# Patient Record
Sex: Female | Born: 1980 | Race: Black or African American | Hispanic: No | Marital: Single | Smoking: Never smoker
Health system: Southern US, Community
[De-identification: ages and names within clinical notes are randomized; demographics above are authoritative.]

## PROBLEM LIST (undated history)

## (undated) DIAGNOSIS — G509 Disorder of trigeminal nerve, unspecified: Secondary | ICD-10-CM

## (undated) DIAGNOSIS — G589 Mononeuropathy, unspecified: Secondary | ICD-10-CM

## (undated) DIAGNOSIS — K219 Gastro-esophageal reflux disease without esophagitis: Secondary | ICD-10-CM

## (undated) HISTORY — PX: OTHER SURGICAL HISTORY: SHX169

## (undated) HISTORY — PX: BRAIN SURGERY: SHX531

---
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
## 2000-04-14 ENCOUNTER — Emergency Department (HOSPITAL_COMMUNITY): Admission: EM | Admit: 2000-04-14 | Discharge: 2000-04-14 | Payer: Self-pay | Admitting: Emergency Medicine

## 2000-06-24 ENCOUNTER — Emergency Department (HOSPITAL_COMMUNITY): Admission: EM | Admit: 2000-06-24 | Discharge: 2000-06-24 | Payer: Self-pay | Admitting: Emergency Medicine

## 2000-12-04 ENCOUNTER — Inpatient Hospital Stay (HOSPITAL_COMMUNITY): Admission: AD | Admit: 2000-12-04 | Discharge: 2000-12-04 | Payer: Self-pay | Admitting: Obstetrics

## 2000-12-08 ENCOUNTER — Inpatient Hospital Stay (HOSPITAL_COMMUNITY): Admission: AD | Admit: 2000-12-08 | Discharge: 2000-12-08 | Payer: Self-pay | Admitting: *Deleted

## 2001-01-25 ENCOUNTER — Inpatient Hospital Stay (HOSPITAL_COMMUNITY): Admission: AD | Admit: 2001-01-25 | Discharge: 2001-01-25 | Payer: Self-pay | Admitting: *Deleted

## 2001-01-26 ENCOUNTER — Inpatient Hospital Stay (HOSPITAL_COMMUNITY): Admission: AD | Admit: 2001-01-26 | Discharge: 2001-01-26 | Payer: Self-pay | Admitting: *Deleted

## 2001-01-26 ENCOUNTER — Encounter: Payer: Self-pay | Admitting: Obstetrics & Gynecology

## 2001-03-24 ENCOUNTER — Inpatient Hospital Stay (HOSPITAL_COMMUNITY): Admission: AD | Admit: 2001-03-24 | Discharge: 2001-03-24 | Payer: Self-pay | Admitting: *Deleted

## 2001-05-06 ENCOUNTER — Inpatient Hospital Stay (HOSPITAL_COMMUNITY): Admission: AD | Admit: 2001-05-06 | Discharge: 2001-05-06 | Payer: Self-pay | Admitting: *Deleted

## 2001-06-04 ENCOUNTER — Inpatient Hospital Stay (HOSPITAL_COMMUNITY): Admission: AD | Admit: 2001-06-04 | Discharge: 2001-06-04 | Payer: Self-pay

## 2001-06-12 ENCOUNTER — Inpatient Hospital Stay (HOSPITAL_COMMUNITY): Admission: AD | Admit: 2001-06-12 | Discharge: 2001-06-12 | Payer: Self-pay | Admitting: *Deleted

## 2001-06-16 ENCOUNTER — Inpatient Hospital Stay (HOSPITAL_COMMUNITY): Admission: AD | Admit: 2001-06-16 | Discharge: 2001-06-16 | Payer: Self-pay | Admitting: *Deleted

## 2001-06-16 ENCOUNTER — Encounter: Payer: Self-pay | Admitting: *Deleted

## 2001-06-18 ENCOUNTER — Inpatient Hospital Stay (HOSPITAL_COMMUNITY): Admission: AD | Admit: 2001-06-18 | Discharge: 2001-06-18 | Payer: Self-pay | Admitting: *Deleted

## 2001-06-22 ENCOUNTER — Inpatient Hospital Stay (HOSPITAL_COMMUNITY): Admission: AD | Admit: 2001-06-22 | Discharge: 2001-06-22 | Payer: Self-pay | Admitting: *Deleted

## 2001-06-23 ENCOUNTER — Inpatient Hospital Stay (HOSPITAL_COMMUNITY): Admission: AD | Admit: 2001-06-23 | Discharge: 2001-06-25 | Payer: Self-pay | Admitting: *Deleted

## 2002-03-11 ENCOUNTER — Emergency Department (HOSPITAL_COMMUNITY): Admission: EM | Admit: 2002-03-11 | Discharge: 2002-03-12 | Payer: Self-pay | Admitting: Emergency Medicine

## 2002-03-25 ENCOUNTER — Emergency Department (HOSPITAL_COMMUNITY): Admission: EM | Admit: 2002-03-25 | Discharge: 2002-03-25 | Payer: Self-pay | Admitting: Emergency Medicine

## 2002-03-29 ENCOUNTER — Emergency Department (HOSPITAL_COMMUNITY): Admission: EM | Admit: 2002-03-29 | Discharge: 2002-03-29 | Payer: Self-pay | Admitting: Emergency Medicine

## 2002-07-26 ENCOUNTER — Encounter: Payer: Self-pay | Admitting: Emergency Medicine

## 2002-07-26 ENCOUNTER — Emergency Department (HOSPITAL_COMMUNITY): Admission: EM | Admit: 2002-07-26 | Discharge: 2002-07-26 | Payer: Self-pay | Admitting: Emergency Medicine

## 2002-07-30 ENCOUNTER — Emergency Department (HOSPITAL_COMMUNITY): Admission: EM | Admit: 2002-07-30 | Discharge: 2002-07-30 | Payer: Self-pay | Admitting: Emergency Medicine

## 2002-07-30 ENCOUNTER — Encounter: Payer: Self-pay | Admitting: Emergency Medicine

## 2002-08-12 ENCOUNTER — Other Ambulatory Visit: Admission: RE | Admit: 2002-08-12 | Discharge: 2002-08-12 | Payer: Self-pay | Admitting: *Deleted

## 2003-02-16 ENCOUNTER — Emergency Department (HOSPITAL_COMMUNITY): Admission: EM | Admit: 2003-02-16 | Discharge: 2003-02-17 | Payer: Self-pay | Admitting: Emergency Medicine

## 2003-04-08 ENCOUNTER — Encounter: Admission: RE | Admit: 2003-04-08 | Discharge: 2003-04-08 | Payer: Self-pay | Admitting: Family Medicine

## 2011-08-29 ENCOUNTER — Encounter (HOSPITAL_COMMUNITY): Payer: Self-pay | Admitting: Emergency Medicine

## 2011-08-29 ENCOUNTER — Emergency Department (HOSPITAL_COMMUNITY)
Admission: EM | Admit: 2011-08-29 | Discharge: 2011-08-29 | Disposition: A | Discharge status: Home / Self Care | Payer: Self-pay | Attending: Emergency Medicine | Admitting: Emergency Medicine

## 2011-08-29 DIAGNOSIS — M545 Low back pain, unspecified: Secondary | ICD-10-CM | POA: Insufficient documentation

## 2011-08-29 DIAGNOSIS — Z975 Presence of (intrauterine) contraceptive device: Secondary | ICD-10-CM | POA: Insufficient documentation

## 2011-08-29 LAB — URINALYSIS, ROUTINE W REFLEX MICROSCOPIC
Bilirubin Urine: NEGATIVE
Hgb urine dipstick: NEGATIVE
Ketones, ur: NEGATIVE mg/dL
Specific Gravity, Urine: 1.039 — ABNORMAL HIGH (ref 1.005–1.030)
Urobilinogen, UA: 0.2 mg/dL (ref 0.0–1.0)

## 2011-08-29 MED ORDER — KETOROLAC TROMETHAMINE 30 MG/ML IJ SOLN
30.0000 mg | Freq: Once | INTRAMUSCULAR | Status: AC
  Administered 2011-08-29: 30 mg via INTRAMUSCULAR
  Filled 2011-08-29: qty 1

## 2011-08-29 MED ORDER — HYDROCODONE-ACETAMINOPHEN 5-500 MG PO TABS: 1.0000 | ORAL_TABLET | Freq: Four times a day (QID) | ORAL | Status: AC | PRN

## 2011-08-29 MED ORDER — CYCLOBENZAPRINE HCL 10 MG PO TABS: 10.0000 mg | ORAL_TABLET | Freq: Two times a day (BID) | ORAL | Status: AC | PRN

## 2011-08-29 NOTE — ED Notes (Addendum)
Pt states she lifted a crate with soda bottles Saturday. Pt reports pain on Sunday when she woke up. Pt unable to find a position of comfort. Pain does not radiate anywhere. Pt reports falling the week before the pain starting

## 2011-08-29 NOTE — ED Provider Notes (Signed)
History     CSN: [id number]  Arrival date & time 08/29/11  1747   First MD Initiated Contact with Patient 08/29/11 1917      Chief Complaint  Patient presents with  . Back Pain    (Consider location/radiation/quality/duration/timing/severity/associated sxs/prior treatment) HPI  Patient presents to the ER with low back pain. She states she was moving heavy bins with sodas and waters with them on Sunday and did not feel any pain at the time. However, yesterday, when she woke up she could not get out of bed because her low back hurt so bad. She denies being pregnant as she has an IUD and she is currently on her period. She has had no urinary symptoms. No bowel incontience. She is ambulatory. She has not numbness, tingling or weakness. No symptoms in her legs. Pt is uncomfortable but is in NAD and VSS.  History reviewed. No pertinent past medical history.  History reviewed. No pertinent past surgical history.  No family history on file.  History  Substance Use Topics  . Smoking status: Never Smoker   . Smokeless tobacco: Not on file  . Alcohol Use: No    OB History    Grav Para Term Preterm Abortions TAB SAB Ect Mult Living                  Review of Systems  Allergies  Review of patient's allergies indicates no known allergies.  Home Medications   Current Outpatient Rx  Name Route Sig Dispense Refill  . IBUPROFEN 200 MG PO TABS Oral Take 200 mg by mouth every 6 (six) hours as needed.    Marland Kitchen NAPROXEN 250 MG PO TABS Oral Take 250 mg by mouth 2 (two) times daily with a meal.      BP 128/76  Pulse 60  Temp(Src) 98.5 F (36.9 C) (Oral)  Resp 14  SpO2 100%  LMP 08/25/2011  Physical Exam  Nursing note and vitals reviewed. Constitutional: She appears well-developed and well-nourished. No distress.  HENT:  Head: Normocephalic and atraumatic.  Eyes: Pupils are equal, round, and reactive to light.  Neck: Normal range of motion. Neck supple.  Cardiovascular: Normal  rate and regular rhythm.   Pulmonary/Chest: Effort normal.  Abdominal: Soft.  Musculoskeletal:       Back:        Equal strength to bilateral lower extremities. Neurosensory  function adequate to both legs. Skin color is normal. Skin is warm and moist. I see no step off deformity, no bony tenderness. Pt is able to ambulate without limp. Pain is relieved when sitting in certain positions. ROM is decreased due to pain. No crepitus, laceration, effusion, swelling.  Pulses are normal   Neurological: She is alert.  Skin: Skin is warm and dry.     HEENT: denies blurry vision or change in hearing PULMONARY: Denies difficulty breathing and SOB CARDIAC: denies chest pain or heart palpitations MUSCULOSKELETAL:  denies being unable to ambulate ABDOMEN AL: denies abdominal pain GU: denies loss of bowel or urinary control NEURO: denies numbness and tingling in extremities     ED Course  Procedures (including critical care time)   Labs Reviewed  URINALYSIS, ROUTINE W REFLEX MICROSCOPIC   No results found.   1. Low back pain       MDM  Patient with back pain. No neurological deficits. Patient is ambulatory. No warning symptoms of back pain including: loss of bowel or bladder control, night sweats, waking from sleep with back pain,  unexplained fevers or weight loss, h/o cancer, IVDU, recent trauma. No concern for cauda equina, epidural abscess, or other serious cause of back pain. Conservative measures such as rest, ice/heat and pain medicine indicated with PCP follow-up if no improvement with conservative management.           Dorthula Matas, PA 08/29/11 2019

## 2011-08-29 NOTE — Discharge Instructions (Signed)
Back Exercises   Back exercises help treat and prevent back injuries. The goal of back exercises is to increase the strength of your abdominal and back muscles and the flexibility of your back. These exercises should be started when you no longer have back pain. Back exercises include:   Pelvic Tilt. Lie on your back with your knees bent. Tilt your pelvis until the lower part of your back is against the floor. Hold this position 5 to 10 sec and repeat 5 to 10 times.   Knee to Chest. Pull first 1 knee up against your chest and hold for 20 to 30 seconds, repeat this with the other knee, and then both knees. This may be done with the other leg straight or bent, whichever feels better.   Sit-Ups or Curl-Ups. Bend your knees 90 degrees. Start with tilting your pelvis, and do a partial, slow sit-up, lifting your trunk only 30 to 45 degrees off the floor. Take at least 2 to 3 seconds for each sit-up. Do not do sit-ups with your knees out straight. If partial sit-ups are difficult, simply do the above but with only tightening your abdominal muscles and holding it as directed.   Hip-Lift. Lie on your back with your knees flexed 90 degrees. Push down with your feet and shoulders as you raise your hips a couple inches off the floor; hold for 10 seconds, repeat 5 to 10 times.   Back arches. Lie on your stomach, propping yourself up on bent elbows. Slowly press on your hands, causing an arch in your low back. Repeat 3 to 5 times. Any initial stiffness and discomfort should lessen with repetition over time.   Shoulder-Lifts. Lie face down with arms beside your body. Keep hips and torso pressed to floor as you slowly lift your head and shoulders off the floor.   Do not overdo your exercises, especially in the beginning. Exercises may cause you some mild back discomfort which lasts for a few minutes; however, if the pain is more severe, or lasts for more than 15 minutes, do not continue exercises until you see your caregiver.  Improvement with exercise therapy for back problems is slow.   See your caregivers for assistance with developing a proper back exercise program.   Document Released: 04/21/2004 Document Revised: 03/03/2011 Document Reviewed: 03/14/2005   Exit[employer]® Patient Information ©2012 [employer].     Back Pain, Adult   Low back pain is very common. About 1 in 5 people have back pain. The cause of low back pain is rarely dangerous. The pain often gets better over time. About half of people with a sudden onset of back pain feel better in just 2 weeks. About 8 in 10 people feel better by 6 weeks.   CAUSES   Some common causes of back pain include:   Strain of the muscles or ligaments supporting the spine.   Wear and tear (degeneration) of the spinal discs.   Arthritis.   Direct injury to the back.   DIAGNOSIS   Most of the time, the direct cause of low back pain is not known. However, back pain can be treated effectively even when the exact cause of the pain is unknown. Answering your caregiver's questions about your overall health and symptoms is one of the most accurate ways to make sure the cause of your pain is not dangerous. If your caregiver needs more information, he or she may order lab work or imaging tests (X-rays or MRIs). However, even   if imaging tests show changes in your back, this usually does not require surgery.   HOME CARE INSTRUCTIONS   For many people, back pain returns. Since low back pain is rarely dangerous, it is often a condition that people can learn to manage on their own.   Remain active. It is stressful on the back to sit or stand in one place. Do not sit, drive, or stand in one place for more than 30 minutes at a time. Take short walks on level surfaces as soon as pain allows. Try to increase the length of time you walk each day.   Do not stay in bed. Resting more than 1 or 2 days can delay your recovery.   Do not avoid exercise or work. Your body is made to move. It is not dangerous to be active,  even though your back may hurt. Your back will likely heal faster if you return to being active before your pain is gone.   Pay attention to your body when you bend and lift. Many people have less discomfort when lifting if they bend their knees, keep the load close to their bodies, and avoid twisting. Often, the most comfortable positions are those that put less stress on your recovering back.   Find a comfortable position to sleep. Use a firm mattress and lie on your side with your knees slightly bent. If you lie on your back, put a pillow under your knees.   Only take over-the-counter or prescription medicines as directed by your caregiver. Over-the-counter medicines to reduce pain and inflammation are often the most helpful. Your caregiver may prescribe muscle relaxant drugs. These medicines help dull your pain so you can more quickly return to your normal activities and healthy exercise.   Put ice on the injured area.   Put ice in a plastic bag.   Place a towel between your skin and the bag.   Leave the ice on for 15 to 20 minutes, 3 to 4 times a day for the first 2 to 3 days. After that, ice and heat may be alternated to reduce pain and spasms.   Ask your caregiver about trying back exercises and gentle massage. This may be of some benefit.   Avoid feeling anxious or stressed. Stress increases muscle tension and can worsen back pain. It is important to recognize when you are anxious or stressed and learn ways to manage it. Exercise is a great option.   SEEK MEDICAL CARE IF:   You have pain that is not relieved with rest or medicine.   You have pain that does not improve in 1 week.   You have new symptoms.   You are generally not feeling well.   SEEK IMMEDIATE MEDICAL CARE IF:   You have pain that radiates from your back into your legs.   You develop new bowel or bladder control problems.   You have unusual weakness or numbness in your arms or legs.   You develop nausea or vomiting.   You develop abdominal  pain.   You feel faint.   Document Released: 03/14/2005 Document Revised: 03/03/2011 Document Reviewed: 08/02/2010   Exit[employer]® Patient Information ©2012 [employer].

## 2011-08-29 NOTE — Progress Notes (Signed)
ED CM spoke with pt who states she has recently moved back to [place], is self pay and without pcp.  CM discussed and provided a list of self pay [place] pcps along with information on [place] resources for medications, health department, DSS, housing, crisis programs, financial resources and dental programs Pt voiced understanding and appreciation of services/resources offered

## 2011-08-29 NOTE — ED Notes (Signed)
Pt presenting to ed with c/o back pain x 2 days. Pt denies any problems with urination. Pt denies injury at this time. Pt denies nausea and vomiting

## 2011-08-30 NOTE — ED Provider Notes (Signed)
Medical screening examination/treatment/procedure(s) were performed by non-physician practitioner and as supervising physician I was immediately available for consultation/collaboration.   Lennin Osmond M Vanassa Penniman, MD 08/30/11 0022 

## 2011-11-02 ENCOUNTER — Encounter (HOSPITAL_COMMUNITY): Payer: Self-pay

## 2011-11-02 ENCOUNTER — Emergency Department (INDEPENDENT_AMBULATORY_CARE_PROVIDER_SITE_OTHER)
Admission: EM | Admit: 2011-11-02 | Discharge: 2011-11-02 | Disposition: A | Discharge status: Home / Self Care | Payer: Self-pay | Source: Home / Self Care | Attending: Emergency Medicine | Admitting: Emergency Medicine

## 2011-11-02 DIAGNOSIS — J020 Streptococcal pharyngitis: Secondary | ICD-10-CM

## 2011-11-02 MED ORDER — PENICILLIN V POTASSIUM 500 MG PO TABS: 500.0000 mg | ORAL_TABLET | Freq: Three times a day (TID) | ORAL | Status: AC

## 2011-11-02 MED ORDER — NAPROXEN 500 MG PO TABS: 500.0000 mg | ORAL_TABLET | Freq: Two times a day (BID) | ORAL | Status: AC

## 2011-11-02 NOTE — ED Provider Notes (Signed)
Chief Complaint  Patient presents with  . Sore Throat    History of Present Illness:   The patient is a 31 year old female who's had a two-day history of a severe sore throat, pain on swallowing, nausea, headache, ear pain, neck pain, and nasal congestion. She denies any fevers, chills, sweats, cough, abdominal pain, or diarrhea. She has not been exposed to strep or mono as far she knows. She has no prior history of strep.  Review of Systems:  Other than as noted above, the patient denies any of the following symptoms. Systemic:  No fever, chills, sweats, fatigue, myalgias, headache, or anorexia. Eye:  No redness, pain or drainage. ENT:  No earache, ear congestion, nasal congestion, sneezing, rhinorrhea, sinus pressure, sinus pain, or post nasal drip. Lungs:  No cough, sputum production, wheezing, shortness of breath, or chest pain. GI:  No abdominal pain, nausea, vomiting, or diarrhea. Skin:  No rash or itching.  PMFSH:  Past medical history, family history, social history, meds, allergies, and nurse's notes were reviewed.  There is no known exposure to strep or mono.  No prior history of step or mono.  The patient denies use of tobacco.  Physical Exam:   Vital signs:  BP 122/77  Pulse 75  Temp 98.6 F (37 C) (Oral)  Resp 17  SpO2 100%  LMP 10/20/2011 General:  Alert, in no distress. Eye:  No conjunctival injection or drainage. Lids were normal. ENT:  TMs and canals were normal, without erythema or inflammation.  Nasal mucosa was clear and uncongested, without drainage.  Mucous membranes were moist.  Exam of pharynx reveals tonsils to be enlarged with whitish exudate and the uvula is also swollen and erythematous.  There were no oral ulcerations or lesions. Neck:  Supple, no adenopathy, tenderness or mass. Lungs:  No respiratory distress.  Lungs were clear to auscultation, without wheezes, rales or rhonchi.  Breath sounds were clear and equal bilaterally.  Heart:  Regular rhythm,  without gallops, murmers or rubs. Skin:  Clear, warm, and dry, without rash or lesions.  Labs:   Results for orders placed during the hospital encounter of 11/02/11  POCT RAPID STREP A ([facility] CARE ONLY)      Component Value Range   Streptococcus, Group A Screen (Direct) POSITIVE (*) NEGATIVE    Assessment:  The encounter diagnosis was Strep throat.  Plan:   1.  The following meds were prescribed:   New Prescriptions   NAPROXEN (NAPROSYN) 500 MG TABLET    Take 1 tablet (500 mg total) by mouth 2 (two) times daily.   PENICILLIN V POTASSIUM (VEETID) 500 MG TABLET    Take 1 tablet (500 mg total) by mouth 3 (three) times daily.   2.  The patient was instructed in symptomatic care including hot saline gargles, throat lozenges, infectious precautions, and need to trade out toothbrush. Handouts were given. 3.  The patient was told to return if becoming worse in any way, if no better in 3 or 4 days, and given some red flag symptoms that would indicate earlier return.     Reuben Likes, MD 11/02/11 [phone]

## 2011-11-02 NOTE — ED Notes (Signed)
C/o nausea since Monday, sore throat and ear pain since yesterday.  States this am her nose was congested.

## 2013-06-25 ENCOUNTER — Encounter (HOSPITAL_COMMUNITY): Payer: Self-pay | Admitting: Emergency Medicine

## 2013-06-25 ENCOUNTER — Emergency Department (HOSPITAL_COMMUNITY)
Admission: EM | Admit: 2013-06-25 | Discharge: 2013-06-25 | Disposition: A | Discharge status: Home / Self Care | Payer: No Typology Code available for payment source | Attending: Emergency Medicine | Admitting: Emergency Medicine

## 2013-06-25 DIAGNOSIS — Z79899 Other long term (current) drug therapy: Secondary | ICD-10-CM | POA: Insufficient documentation

## 2013-06-25 DIAGNOSIS — H9209 Otalgia, unspecified ear: Secondary | ICD-10-CM | POA: Insufficient documentation

## 2013-06-25 DIAGNOSIS — R6884 Jaw pain: Secondary | ICD-10-CM | POA: Insufficient documentation

## 2013-06-25 MED ORDER — MELOXICAM 7.5 MG PO TABS
15.0000 mg | ORAL_TABLET | Freq: Every day | ORAL | Status: DC
Start: 2013-06-25 — End: 2014-01-23

## 2013-06-25 NOTE — ED Provider Notes (Signed)
CSN: [id number]     Arrival date & time 06/25/13  0018 History   First MD Initiated Contact with Patient 06/25/13 0103     Chief Complaint  Patient presents with  . Otalgia  . Jaw Pain     (Consider location/radiation/quality/duration/timing/severity/associated sxs/prior Treatment) HPI Comments: Patient is a 33 year old female with no significant past medical history who presents to the emergency department for left-sided jaw pain. Patient states the pain originates in her preauricular area and radiates down her mandible and back into her ear. She states the pain "comes in waves" and is sharp. She has taken Tylenol #3 without relief. Patient saw her dentist for this last week who could not find anything wrong, but put her on Veetid to cover for infection. She endorses compliance with this abx with no symptom improvement. Patient states pain is worse with jaw opening; she denies trismus. She further denies dental pain/injury, oral bleeding/lesions, difficulty swallowing, drooling, fever, ear drainage, and shortness of breath.  Patient is a 33 y.o. female presenting with ear pain. The history is provided by the patient. No language interpreter was used.  Otalgia   History reviewed. No pertinent past medical history. History reviewed. No pertinent past surgical history. No family history on file. History  Substance Use Topics  . Smoking status: Never Smoker   . Smokeless tobacco: Not on file  . Alcohol Use: Yes     Comment: occ   OB History   Grav Para Term Preterm Abortions TAB SAB Ect Mult Living                 Review of Systems  HENT: Positive for ear pain. Negative for dental problem.        +jaw pain  All other systems reviewed and are negative.      Allergies  Review of patient's allergies indicates no known allergies.  Home Medications   Current Outpatient Rx  Name  Route  Sig  Dispense  Refill  . acetaminophen-codeine (TYLENOL #3) 300-30 MG per tablet   Oral  Take 1 tablet by mouth every 4 (four) hours as needed for moderate pain (pain).         Marland Kitchen HYDROcodone-acetaminophen (NORCO/VICODIN) 5-325 MG per tablet   Oral   Take 1 tablet by mouth every 6 (six) hours as needed for moderate pain (pain).         . Ibuprofen (ADVIL) 200 MG CAPS   Oral   Take 600 mg by mouth every 8 (eight) hours as needed (pain).         Marland Kitchen penicillin v potassium (VEETID) 500 MG tablet   Oral   Take 500 mg by mouth 4 (four) times daily.         . meloxicam (MOBIC) 7.5 MG tablet   Oral   Take 2 tablets (15 mg total) by mouth daily.   30 tablet   0    BP 144/87  Pulse 64  Temp(Src) 98.4 F (36.9 C) (Oral)  Resp 18  SpO2 99%  LMP 06/08/2013  Physical Exam  Nursing note and vitals reviewed. Constitutional: She is oriented to person, place, and time. She appears well-developed and well-nourished. No distress.  HENT:  Head: Normocephalic and atraumatic. Head is without raccoon's eyes, without Battle's sign and without contusion.    Right Ear: Hearing, tympanic membrane, external ear and ear canal normal. No mastoid tenderness.  Left Ear: Hearing, tympanic membrane, external ear and ear canal normal. No mastoid tenderness.  Nose:  Nose normal.  Mouth/Throat: Uvula is midline, oropharynx is clear and moist and mucous membranes are normal. No oral lesions. No trismus in the jaw. Normal dentition. No dental abscesses or dental caries.  TTP at preauricular area on L. Site c/w location of L TMJ joint. No evidence of infectious symptoms; no erythema, swelling, heat to touch, or trismus. No vesicles in or changes to L ear canal. No TTP of dentition. No evidence of dental abscess. Your midline. Patient tolerating secretions without difficulty or drooling.  Eyes: Conjunctivae and EOM are normal. No scleral icterus.  Neck: Normal range of motion.  Pulmonary/Chest: Effort normal. No respiratory distress.  Musculoskeletal: Normal range of motion.  Neurological: She  is alert and oriented to person, place, and time.  Skin: Skin is warm and dry. No rash noted. She is not diaphoretic. No erythema. No pallor.  Psychiatric: She has a normal mood and affect. Her behavior is normal.    ED Course  Procedures (including critical care time) Labs Review Labs Reviewed - No data to display Imaging Review No results found.   EKG Interpretation None      MDM   Final diagnoses:  Jaw pain    Uncomplicated jaw pain. Patient well and nontoxic appearing, hemodynamically stable, and afebrile. No trismus or strider appreciated. Patient speaks in full sentences without difficulty. She is tolerating secretions without difficulty or drooling. No dental changes. No red flags or signs concerning for Ludwig's angina. Pain appreciated to the consistent with site of left TMJ - ? TMJ arthritis vs trigeminal neuralgia. No signs or symptoms to suggest infectious source. L ear, canal and mastoid process normal. Patient stable and appropriate for d/c today with ENT referral for further evaluation of symptoms. Mobic prescribed for symptom control. Return precautions provided and patient agreeable to plan with no unaddressed concerns.   Filed Vitals:   06/25/13 0034  BP: 144/87  Pulse: 64  Temp: 98.4 F (36.9 C)  TempSrc: Oral  Resp: 18  SpO2: 99%       Antonietta Breach, PA-C 06/25/13 [phone]

## 2013-06-25 NOTE — Discharge Instructions (Signed)
Recommend ice, Mobic, and ENT follow up. Return if symptoms worsen.  Temporomandibular Problems  Temporomandibular joint (TMJ) dysfunction means there are problems with the joint between your jaw and your skull. This is a joint lined by cartilage like other joints in your body but also has a small disc in the joint which keeps the bones from rubbing on each other. These joints are like other joints and can get inflamed (sore) from arthritis and other problems. When this joint gets sore, it can cause headaches and pain in the jaw and the face. CAUSES  Usually the arthritic types of problems are caused by soreness in the joint. Soreness in the joint can also be caused by overuse. This may come from grinding your teeth. It may also come from mis-alignment in the joint. DIAGNOSIS Diagnosis of this condition can often be made by history and exam. Sometimes your caregiver may need X-rays or an MRI scan to determine the exact cause. It may be necessary to see your dentist to determine if your teeth and jaws are lined up correctly. TREATMENT  Most of the time this problem is not serious; however, sometimes it can persist (become chronic). When this happens medications that will cut down on inflammation (soreness) help. Sometimes a shot of cortisone into the joint will be helpful. If your teeth are not aligned it may help for your dentist to make a splint for your mouth that can help this problem. If no physical problems can be found, the problem may come from tension. If tension is found to be the cause, biofeedback or relaxation techniques may be helpful. HOME CARE INSTRUCTIONS   Later in the day, applications of ice packs may be helpful. Ice can be used in a plastic bag with a towel around it to prevent frostbite to skin. This may be used about every 2 hours for 20 to 30 minutes, as needed while awake, or as directed by your caregiver.  Only take over-the-counter or prescription medicines for pain,  discomfort, or fever as directed by your caregiver.  If physical therapy was prescribed, follow your caregiver's directions.  Wear mouth appliances as directed if they were given. Document Released: 12/07/2000 Document Revised: 06/06/2011 Document Reviewed: 03/16/2008 [facility] Patient Information 2014 [place], [place].

## 2013-06-25 NOTE — ED Notes (Signed)
Pt states that she has been congested the last few days and Saturday began with left ear pain, left sided jaw pain and pain to left side if face; denies chest pain or shortness of breath

## 2013-06-25 NOTE — ED Provider Notes (Signed)
Medical screening examination/treatment/procedure(s) were conducted as a shared visit with non-physician practitioner(s) or resident  and myself.  I personally evaluated the patient during the encounter and agree with the findings and plan unless otherwise indicated.    I have personally reviewed any xrays and/ or EKG's with the provider and I agree with interpretation.   Patient with worsening left jaw pain. Patient describes severe sharp pain radiating into her left ear and surrounding TMJ region. No history of similar. Patient has tried Tylenol No. 3 and over-the-counter medicines with minimal relief. Patient has seen a dentist who started her on antibiotics. On exam patient is exquisitely tender to the Tmj region worse with opening and closing her mouth, no signs of infection, moist mucous membranes no dental abscess appreciated. Patient denies chest pain or shortness of breath or cardiac history. Clinically pain is related to TMJ and followup with ENT discussed.  TMJ pain  Christine Clonts, MD 06/25/13 [phone]

## 2013-07-22 ENCOUNTER — Emergency Department (HOSPITAL_COMMUNITY): Payer: No Typology Code available for payment source

## 2013-07-22 ENCOUNTER — Emergency Department (HOSPITAL_COMMUNITY)
Admission: EM | Admit: 2013-07-22 | Discharge: 2013-07-22 | Disposition: A | Discharge status: Home / Self Care | Payer: No Typology Code available for payment source | Attending: Emergency Medicine | Admitting: Emergency Medicine

## 2013-07-22 ENCOUNTER — Encounter (HOSPITAL_COMMUNITY): Payer: Self-pay | Admitting: Emergency Medicine

## 2013-07-22 DIAGNOSIS — IMO0002 Reserved for concepts with insufficient information to code with codable children: Secondary | ICD-10-CM | POA: Insufficient documentation

## 2013-07-22 DIAGNOSIS — R5381 Other malaise: Secondary | ICD-10-CM | POA: Insufficient documentation

## 2013-07-22 DIAGNOSIS — E669 Obesity, unspecified: Secondary | ICD-10-CM | POA: Insufficient documentation

## 2013-07-22 DIAGNOSIS — G500 Trigeminal neuralgia: Secondary | ICD-10-CM | POA: Insufficient documentation

## 2013-07-22 DIAGNOSIS — R5383 Other fatigue: Secondary | ICD-10-CM

## 2013-07-22 DIAGNOSIS — Z791 Long term (current) use of non-steroidal anti-inflammatories (NSAID): Secondary | ICD-10-CM | POA: Insufficient documentation

## 2013-07-22 MED ORDER — CARBAMAZEPINE ER 100 MG PO TB12: 100.0000 mg | ORAL_TABLET | Freq: Two times a day (BID) | ORAL | Status: DC

## 2013-07-22 NOTE — ED Provider Notes (Signed)
CSN: [id number]     Arrival date & time 07/22/13  7829 History   First MD Initiated Contact with Patient 07/22/13 [phone]     Chief Complaint  Patient presents with  . facial numbness      (Consider location/radiation/quality/duration/timing/severity/associated sxs/prior Treatment) The history is provided by the patient.   patient presents with left jaw and face pain. She has had pain in the left jaw for the last few months it is coming done. She's been seen in ER and diagnosed with trigeminal neuralgia versus TMJ. She states she is improved somewhat on Mobic. She states she followup with her dentist to give her antibiotics but thought it was not the jaw causing the pain. She states that the pain began to be worse 3 days ago. It is in the same distribution as before, which is in the left jaw and face. She states now that she has numbness on her jaw and cannot smile. No headache. She states she had some drooling earlier today. She had a complex tooth extraction back in August, 8 months ago. She states she had oral surgery because was too deep. She states she went to an urgent care 2 days ago and was told that the numbness and pain may be due to scar tissue from the extraction. Patient states she has pain opening and closing her jaw. No difficulty swallowing.no rash. No fevers  History reviewed. No pertinent past medical history. History reviewed. No pertinent past surgical history. No family history on file. History  Substance Use Topics  . Smoking status: Never Smoker   . Smokeless tobacco: Not on file  . Alcohol Use: Yes     Comment: occ   OB History   Grav Para Term Preterm Abortions TAB SAB Ect Mult Living                 Review of Systems  Constitutional: Negative for activity change and appetite change.  Eyes: Negative for pain.  Respiratory: Negative for chest tightness and shortness of breath.   Cardiovascular: Negative for chest pain and leg swelling.  Gastrointestinal: Negative  for nausea, vomiting, abdominal pain and diarrhea.  Genitourinary: Negative for flank pain.  Musculoskeletal: Negative for back pain, joint swelling and neck stiffness.  Skin: Negative for rash.  Neurological: Positive for weakness and numbness. Negative for headaches.  Psychiatric/Behavioral: Negative for behavioral problems.      Allergies  Review of patient's allergies indicates no known allergies.  Home Medications   Prior to Admission medications   Medication Sig Start Date End Date Taking? Authorizing Provider  HYDROcodone-acetaminophen (NORCO/VICODIN) 5-325 MG per tablet Take 1 tablet by mouth every 6 (six) hours as needed for moderate pain (pain).   Yes Historical Provider, MD  Ibuprofen (ADVIL) 200 MG CAPS Take 600 mg by mouth every 8 (eight) hours as needed (for pain).    Yes Historical Provider, MD  meloxicam (MOBIC) 7.5 MG tablet Take 2 tablets (15 mg total) by mouth daily. 06/25/13  Yes Antonietta Breach, PA-C  predniSONE (DELTASONE) 20 MG tablet Take 20 mg by mouth daily with breakfast. 07/20/13  Yes Historical Provider, MD   BP 133/82  Pulse 76  Temp(Src) 98.8 F (37.1 C) (Oral)  Resp 14  Ht 5\' 4"  (1.626 m)  Wt 274 lb (124.286 kg)  BMI 47.01 kg/m2  SpO2 100%  LMP 06/30/2013 Physical Exam  Constitutional: She is oriented to person, place, and time. She appears well-developed.  Patient is somewhat obese  HENT:  Head:  Normocephalic.  Post dental extraction of tooth over left somewhat posterior mandible. Some tenderness at this site without fluctuance. Number next anterior to the spot. Some tenderness over left TMJ. Difficulty opening jaw due to pain.  Eyes: Pupils are equal, round, and reactive to light.  Neck: Normal range of motion. No thyromegaly present.  Cardiovascular: Normal rate and regular rhythm.   Pulmonary/Chest: Breath sounds normal.  Neurological: She is alert and oriented to person, place, and time. A cranial nerve deficit is present.  Sensation intact  bilaterally forehead. Pain with light touch over V2 distribution on left. Normal on right. Numbness over anterior distribution on C3. There is tenderness over the mental foramen. Some difficulty with smiling on the left side. Good eyebrow raise bilaterally. Good eye closing bilaterally.  Skin: Skin is warm.    ED Course  Procedures (including critical care time) Labs Review Labs Reviewed - No data to display  Imaging Review Dg Orthopantogram  07/22/2013   CLINICAL DATA:  LEFT jaw pain and numbers for 3 days  EXAM: ORTHOPANTOGRAM/PANORAMIC  COMPARISON:  None.  FINDINGS: Surgical absence of teeth 15, 16, 17, and 19.  Scattered dental fillings.  No periodontal lucencies.  No mandibular fracture or bone destruction.  Osseous mineralization normal.  IMPRESSION: Prior dental extractions.  No acute abnormalities.   Electronically Signed   By: Lavonia Dana M.D.   On: 07/22/2013 10:49     EKG Interpretation None      MDM   Final diagnoses:  Trigeminal neuralgia    Patient has left face numbness with some weakness. Also has pain. Panorex is reassuring. No other neurologic deficits. They be related to a trigeminal neuralgia. I discussed with neurology, however they have not seen the patient. Will start Tegretol and have patient follow with neurology and will likely require an outpatient MRI. Will discharge home    Jasper Riling. Alvino Chapel, MD 07/22/13 1227

## 2013-07-22 NOTE — ED Notes (Addendum)
Pt went to urgent care on Saturday with this complaint, provider put her on prednisone and gave a shot of torodol per patient, states she has not had any medications this morning. Pt states she has been in excruciating pain since Wednesday, states she woke up with it. Pt states that urgent care provider stated numbness may be r/t to tooth extraction in August.

## 2013-07-22 NOTE — Discharge Instructions (Signed)
Trigeminal Neuralgia Trigeminal neuralgia is a nerve disorder that causes sudden attacks of severe facial pain. It is caused by damage to the trigeminal nerve, a major nerve in the face. It is more common in women and in the elderly, although it can also happen in younger patients. Attacks last from a few seconds to several minutes and can occur from a couple of times per year to several times per day. Trigeminal neuralgia can be a very distressing and disabling condition. Surgery may be needed in very severe cases if medical treatment does not give relief. HOME CARE INSTRUCTIONS   If your caregiver prescribed medication to help prevent attacks, take as directed.  To help prevent attacks:  Chew on the unaffected side of the mouth.  Avoid touching your face.  Avoid blasts of hot or cold air.  Men may wish to grow a beard to avoid having to shave. SEEK IMMEDIATE MEDICAL CARE IF:  Pain is unbearable and your medicine does not help.  You develop new, unexplained symptoms (problems).  You have problems that may be related to a medication you are taking. Document Released: 03/11/2000 Document Revised: 06/06/2011 Document Reviewed: 01/09/2009 [employer] Patient Information 2014 [employer].  

## 2013-07-22 NOTE — ED Notes (Signed)
Per pt, states left jaw pain and facial numbness since Wed

## 2014-01-22 ENCOUNTER — Encounter (HOSPITAL_COMMUNITY): Payer: Self-pay | Admitting: Emergency Medicine

## 2014-01-22 ENCOUNTER — Emergency Department (HOSPITAL_COMMUNITY)
Admission: EM | Admit: 2014-01-22 | Discharge: 2014-01-23 | Disposition: A | Discharge status: Home / Self Care | Payer: No Typology Code available for payment source | Attending: Emergency Medicine | Admitting: Emergency Medicine

## 2014-01-22 DIAGNOSIS — R55 Syncope and collapse: Secondary | ICD-10-CM | POA: Diagnosis not present

## 2014-01-22 DIAGNOSIS — R4020 Unspecified coma: Secondary | ICD-10-CM | POA: Diagnosis present

## 2014-01-22 DIAGNOSIS — Z7952 Long term (current) use of systemic steroids: Secondary | ICD-10-CM | POA: Insufficient documentation

## 2014-01-22 DIAGNOSIS — Z8669 Personal history of other diseases of the nervous system and sense organs: Secondary | ICD-10-CM | POA: Insufficient documentation

## 2014-01-22 DIAGNOSIS — Z791 Long term (current) use of non-steroidal anti-inflammatories (NSAID): Secondary | ICD-10-CM | POA: Insufficient documentation

## 2014-01-22 DIAGNOSIS — Z79899 Other long term (current) drug therapy: Secondary | ICD-10-CM | POA: Diagnosis not present

## 2014-01-22 HISTORY — DX: Mononeuropathy, unspecified: G58.9

## 2014-01-22 HISTORY — DX: Disorder of trigeminal nerve, unspecified: G50.9

## 2014-01-22 NOTE — ED Notes (Signed)
Bed: WA14 Expected date:  Expected time:  Means of arrival:  Comments: EMS/syncope 

## 2014-01-22 NOTE — ED Notes (Signed)
CBG 117 

## 2014-01-22 NOTE — ED Notes (Signed)
Per EMS pt from church, had a syncopal episode in the middle of a celebration at church, pt was taken to another room, pt was emotionally upset, pt states was at the doctor's today and had 11 vials of blood drawn, pt has had recent issues w/ syncopal episodes, pt states it's a "nerve disorder", this is why pt had blood drawn earlier today, pt is on keppra for this nerve disorder. BP 110/64, HR 96 sinus

## 2014-01-23 ENCOUNTER — Encounter (HOSPITAL_COMMUNITY): Payer: Self-pay | Admitting: Emergency Medicine

## 2014-01-23 LAB — I-STAT TROPONIN, ED: TROPONIN I, POC: 0.01 ng/mL (ref 0.00–0.08)

## 2014-01-23 LAB — I-STAT CHEM 8, ED
BUN: 9 mg/dL (ref 6–23)
CALCIUM ION: 1.16 mmol/L (ref 1.12–1.23)
CHLORIDE: 106 meq/L (ref 96–112)
Creatinine, Ser: 1 mg/dL (ref 0.50–1.10)
GLUCOSE: 103 mg/dL — AB (ref 70–99)
HEMATOCRIT: 44 % (ref 36.0–46.0)
HEMOGLOBIN: 15 g/dL (ref 12.0–15.0)
POTASSIUM: 3.8 meq/L (ref 3.7–5.3)
Sodium: 141 mEq/L (ref 137–147)
TCO2: 26 mmol/L (ref 0–100)

## 2014-01-23 LAB — CBG MONITORING, ED: GLUCOSE-CAPILLARY: 95 mg/dL (ref 70–99)

## 2014-01-23 MED ORDER — SODIUM CHLORIDE 0.9 % IV SOLN
1000.0000 mL | Freq: Once | INTRAVENOUS | Status: AC
  Administered 2014-01-23: 1000 mL via INTRAVENOUS

## 2014-01-23 MED ORDER — SODIUM CHLORIDE 0.9 % IV SOLN
1000.0000 mL | INTRAVENOUS | Status: DC
  Administered 2014-01-23: 1000 mL via INTRAVENOUS

## 2014-01-23 MED ORDER — ONDANSETRON HCL 4 MG/2ML IJ SOLN
4.0000 mg | Freq: Once | INTRAMUSCULAR | Status: AC
  Administered 2014-01-23: 4 mg via INTRAVENOUS
  Filled 2014-01-23: qty 2

## 2014-01-23 NOTE — ED Provider Notes (Signed)
CSN: [id number]     Arrival date & time 01/22/14  2353 History   First MD Initiated Contact with Patient 01/23/14 0014     Chief Complaint  Patient presents with  . Loss of Consciousness     (Consider location/radiation/quality/duration/timing/severity/associated sxs/prior Treatment) The history is provided by the patient and medical records. No language interpreter was used.    Christine Alvarado is a 33 y.o. female  with a hx of "nerve disorder" presents to the Emergency Department complaining of acute syncopal episode while at church tonight onset 30 min ago.  Pt reports she got on her knees to pray, felt poorly then passed out.  She endorses lightheadedness and a feeling of spinning around the room.  Pt reports its better with her eyes closed.  Pt reports this has happened before, last episode Oct 11th.  Pt reports she saw Dr. Baltazar Najjar [facility] neurology today who drew 11 vials of blood. Pt reports she went for the trigeminal neuralgia. No aggravating or alleviating factors.  Pt denies fever, chills, headache, neck pain, chest pain, SOB, abd pain, N/V/D, weakness, dysuria.  .     Past Medical History  Diagnosis Date  . Nerve disorder   . Trigeminal nerve disease    History reviewed. No pertinent past surgical history. No family history on file. History  Substance Use Topics  . Smoking status: Never Smoker   . Smokeless tobacco: Not on file  . Alcohol Use: Yes     Comment: occ   OB History   Grav Para Term Preterm Abortions TAB SAB Ect Mult Living                 Review of Systems  Constitutional: Negative for fever, diaphoresis, appetite change, fatigue and unexpected weight change.  HENT: Negative for mouth sores.   Eyes: Negative for visual disturbance.  Respiratory: Negative for cough, chest tightness, shortness of breath and wheezing.   Cardiovascular: Negative for chest pain.  Gastrointestinal: Negative for nausea, vomiting, abdominal pain, diarrhea and constipation.   Endocrine: Negative for polydipsia, polyphagia and polyuria.  Genitourinary: Negative for dysuria, urgency, frequency and hematuria.  Musculoskeletal: Negative for back pain and neck stiffness.  Skin: Negative for rash.  Allergic/Immunologic: Negative for immunocompromised state.  Neurological: Positive for dizziness, syncope and light-headedness. Negative for headaches.  Hematological: Does not bruise/bleed easily.  Psychiatric/Behavioral: Negative for sleep disturbance. The patient is not nervous/anxious.       Allergies  Review of patient's allergies indicates no known allergies.  Home Medications   Prior to Admission medications   Medication Sig Start Date End Date Taking? Authorizing Provider  levETIRAcetam (KEPPRA XR) 500 MG 24 hr tablet Take 500-1,000 mg by mouth 2 (two) times daily. 500mg  in the morning and 1000mg  in the evening.   Yes Historical Provider, MD  carbamazepine (TEGRETOL-XR) 100 MG 12 hr tablet Take 1 tablet (100 mg total) by mouth 2 (two) times daily. 07/22/13   Jasper Riling. Pickering, MD  HYDROcodone-acetaminophen (NORCO/VICODIN) 5-325 MG per tablet Take 1 tablet by mouth every 6 (six) hours as needed for moderate pain (pain).    Historical Provider, MD  Ibuprofen (ADVIL) 200 MG CAPS Take 600 mg by mouth every 8 (eight) hours as needed (for pain).     Historical Provider, MD  meloxicam (MOBIC) 7.5 MG tablet Take 2 tablets (15 mg total) by mouth daily. 06/25/13   Antonietta Breach, PA-C  predniSONE (DELTASONE) 20 MG tablet Take 20 mg by mouth daily with breakfast.  07/20/13   Historical Provider, MD   BP 116/88  Pulse 93  Temp(Src) 98 F (36.7 C) (Oral)  Resp 22  SpO2 100%  LMP 01/18/2014 Physical Exam  Nursing note and vitals reviewed. Constitutional: She is oriented to person, place, and time. She appears well-developed and well-nourished. No distress.  HENT:  Head: Normocephalic and atraumatic.  Mouth/Throat: Oropharynx is clear and moist. No oropharyngeal exudate.   Eyes: Conjunctivae and EOM are normal. Pupils are equal, round, and reactive to light. No scleral icterus.  No horizontal, vertical or rotational nystagmus  Neck: Normal range of motion. Neck supple.  Full active and passive ROM without pain No midline or paraspinal tenderness No nuchal rigidity or meningeal signs  Cardiovascular: Normal rate, regular rhythm, normal heart sounds and intact distal pulses.   No murmur heard. Pulmonary/Chest: Effort normal and breath sounds normal. No respiratory distress. She has no wheezes. She has no rales.  Abdominal: Soft. Bowel sounds are normal. She exhibits no distension. There is no tenderness. There is no rebound and no guarding.  Musculoskeletal: Normal range of motion.  Full range of motion of the T-spine and L-spine No tenderness to palpation of the spinous processes of the T-spine or L-spine No tenderness to palpation of the paraspinous muscles of the L-spine  Lymphadenopathy:    She has no cervical adenopathy.  Neurological: She is alert and oriented to person, place, and time. She has normal reflexes. No cranial nerve deficit. She exhibits normal muscle tone. Coordination normal.  Reflex Scores:      Bicep reflexes are 2+ on the right side and 2+ on the left side.      Brachioradialis reflexes are 2+ on the right side and 2+ on the left side.      Patellar reflexes are 2+ on the right side and 2+ on the left side.      Achilles reflexes are 2+ on the right side and 2+ on the left side. Mental Status:  Alert, oriented, thought content appropriate. Speech fluent without evidence of aphasia. Able to follow 2 step commands without difficulty.  Cranial Nerves:  II:  Peripheral visual fields grossly normal, pupils equal, round, reactive to light III,IV, VI: ptosis not present, extra-ocular motions intact bilaterally  V,VII: smile symmetric, facial light touch sensation equal VIII: hearing grossly normal bilaterally  IX,X: gag reflex present   XI: bilateral shoulder shrug equal and strong XII: midline tongue extension  Motor:  5/5 in upper and lower extremities bilaterally including strong and equal grip strength and dorsiflexion/plantar flexion Sensory: Pinprick and light touch normal in all extremities.  Deep Tendon Reflexes: 2+ and symmetric  Cerebellar: normal finger-to-nose with bilateral upper extremities Gait: gait testing deferred CV: distal pulses palpable throughout   Skin: Skin is warm and dry. No rash noted. She is not diaphoretic. No erythema.  Psychiatric: She has a normal mood and affect. Her behavior is normal. Judgment and thought content normal.    ED Course  Procedures (including critical care time) Labs Review Labs Reviewed  I-STAT CHEM 8, ED - Abnormal; Notable for the following:    Glucose, Bld 103 (*)    All other components within normal limits  URINALYSIS, ROUTINE W REFLEX MICROSCOPIC  POCT CBG (FASTING - GLUCOSE)-MANUAL ENTRY  I-STAT TROPOININ, ED  CBG MONITORING, ED    Imaging Review No results found.   EKG Interpretation   Date/Time:  Thursday January 23 2014 00:45:00 EDT Ventricular Rate:  58 PR Interval:  122 QRS Duration: 85  QT Interval:  423 QTC Calculation: 415 R Axis:   24 Text Interpretation:  Sinus rhythm Consider anterior infarct Confirmed by  OTTER  MD, OLGA ([id number]) on 01/23/2014 12:49:34 AM      MDM   Final diagnoses:  Syncope and collapse   Juri L Rossner presents after syncope.  Patient without arrhythmia or tachycardia while here in the department.  Patient without history of congestive heart failure, normal hematocrit, normal ECG, no shortness of breath and systolic blood pressure greater than 90; patient is low risk. Will plan for discharge home with close cardiology follow-up.  She has been given fluid bolus with resolution od dizziness and lightheadedness.  No orthostatic vital signs or hypotension.  Pt ambulates in room without difficulty.  Possibility of  recurrent syncope has been discussed. I discussed reasons to avoid driving until neurology followup and other safety preventions including use of ladders and working at heights.   Pt has remained hemodynamically stable throughout their time in the ED  BP 116/88  Pulse 93  Temp(Src) 98 F (36.7 C) (Oral)  Resp 22  SpO2 100%  LMP 01/18/2014   The patient was discussed with Dr. Sharol Given who agrees with the treatment plan.    Jarrett Soho Marlis Oldaker, PA-C 01/23/14 0101

## 2014-01-23 NOTE — ED Provider Notes (Signed)
Medical screening examination/treatment/procedure(s) were performed by non-physician practitioner and as supervising physician I was immediately available for consultation/collaboration.   EKG Interpretation   Date/Time:  Thursday January 23 2014 00:45:00 EDT Ventricular Rate:  58 PR Interval:  122 QRS Duration: 85 QT Interval:  423 QTC Calculation: 415 R Axis:   24 Text Interpretation:  Sinus rhythm Consider anterior infarct Confirmed by  Roniesha Hollingshead  MD, Zivah Mayr ([id number]) on 01/23/2014 12:49:34 AM       Kalman Drape, MD 01/23/14 [phone]

## 2014-01-23 NOTE — ED Notes (Signed)
Pt states she was at church, knelt down to pray and started feeling dizzy, sat down and then doesn't remember after that, states has a nerve disorder and went to her PCP today and had a lot of blood drawn, states she was told to go home and lay down d/t the amount drawn, states has a hx of syncopal episodes, pt states the room is spinning.

## 2014-01-23 NOTE — Discharge Instructions (Signed)
1. Medications: usual home medications 2. Treatment: rest, drink plenty of fluids,  3. Follow Up: Please followup with your primary doctor in 7 days for discussion of your diagnoses and further evaluation after today's visit; if you do not have a primary care doctor use the resource guide provided to find one; Please return to the ER for recurrent episodes    Syncope Syncope is a medical term for fainting or passing out. This means you lose consciousness and drop to the ground. People are generally unconscious for less than 5 minutes. You may have some muscle twitches for up to 15 seconds before waking up and returning to normal. Syncope occurs more often in older adults, but it can happen to anyone. While most causes of syncope are not dangerous, syncope can be a sign of a serious medical problem. It is important to seek medical care.  CAUSES  Syncope is caused by a sudden drop in blood flow to the brain. The specific cause is often not determined. Factors that can bring on syncope include:  Taking medicines that lower blood pressure.  Sudden changes in posture, such as standing up quickly.  Taking more medicine than prescribed.  Standing in one place for too long.  Seizure disorders.  Dehydration and excessive exposure to heat.  Low blood sugar (hypoglycemia).  Straining to have a bowel movement.  Heart disease, irregular heartbeat, or other circulatory problems.  Fear, emotional distress, seeing blood, or severe pain. SYMPTOMS  Right before fainting, you may:  Feel dizzy or light-headed.  Feel nauseous.  See all white or all black in your field of vision.  Have cold, clammy skin. DIAGNOSIS  Your health care provider will ask about your symptoms, perform a physical exam, and perform an electrocardiogram (ECG) to record the electrical activity of your heart. Your health care provider may also perform other heart or blood tests to determine the cause of your syncope which may  include:  Transthoracic echocardiogram (TTE). During echocardiography, sound waves are used to evaluate how blood flows through your heart.  Transesophageal echocardiogram (TEE).  Cardiac monitoring. This allows your health care provider to monitor your heart rate and rhythm in real time.  Holter monitor. This is a portable device that records your heartbeat and can help diagnose heart arrhythmias. It allows your health care provider to track your heart activity for several days, if needed.  Stress tests by exercise or by giving medicine that makes the heart beat faster. TREATMENT  In most cases, no treatment is needed. Depending on the cause of your syncope, your health care provider may recommend changing or stopping some of your medicines. HOME CARE INSTRUCTIONS  Have someone stay with you until you feel stable.  Do not drive, use machinery, or play sports until your health care provider says it is okay.  Keep all follow-up appointments as directed by your health care provider.  Lie down right away if you start feeling like you might faint. Breathe deeply and steadily. Wait until all the symptoms have passed.  Drink enough fluids to keep your urine clear or pale yellow.  If you are taking blood pressure or heart medicine, get up slowly and take several minutes to sit and then stand. This can reduce dizziness. SEEK IMMEDIATE MEDICAL CARE IF:   You have a severe headache.  You have unusual pain in the chest, abdomen, or back.  You are bleeding from your mouth or rectum, or you have black or tarry stool.  You have  an irregular or very fast heartbeat.  You have pain with breathing.  You have repeated fainting or seizure-like jerking during an episode.  You faint when sitting or lying down.  You have confusion.  You have trouble walking.  You have severe weakness.  You have vision problems. If you fainted, call your local emergency services (911 in [place] Do not drive  yourself to the hospital.  MAKE SURE YOU:  Understand these instructions.  Will watch your condition.  Will get help right away if you are not doing well or get worse. Document Released: 03/14/2005 Document Revised: 03/19/2013 Document Reviewed: 05/13/2011 [facility] Patient Information 2015 [place], [place]. This information is not intended to replace advice given to you by your health care provider. Make sure you discuss any questions you have with your health care provider.

## 2014-01-23 NOTE — ED Notes (Signed)
Pt started breathing heavy when standing for orthostatic vitals, states "I need to sit down, I'm going to pass out", pt was able to stand to get BP.

## 2014-02-08 ENCOUNTER — Encounter (HOSPITAL_COMMUNITY): Payer: Self-pay | Admitting: Emergency Medicine

## 2014-02-08 ENCOUNTER — Emergency Department (HOSPITAL_COMMUNITY): Payer: No Typology Code available for payment source

## 2014-02-08 ENCOUNTER — Emergency Department (HOSPITAL_COMMUNITY)
Admission: EM | Admit: 2014-02-08 | Discharge: 2014-02-08 | Disposition: A | Discharge status: Home / Self Care | Payer: No Typology Code available for payment source | Attending: Emergency Medicine | Admitting: Emergency Medicine

## 2014-02-08 DIAGNOSIS — R42 Dizziness and giddiness: Secondary | ICD-10-CM | POA: Insufficient documentation

## 2014-02-08 DIAGNOSIS — R110 Nausea: Secondary | ICD-10-CM | POA: Diagnosis not present

## 2014-02-08 DIAGNOSIS — R55 Syncope and collapse: Secondary | ICD-10-CM | POA: Diagnosis not present

## 2014-02-08 DIAGNOSIS — Z79899 Other long term (current) drug therapy: Secondary | ICD-10-CM | POA: Diagnosis not present

## 2014-02-08 LAB — BASIC METABOLIC PANEL
Anion gap: 11 (ref 5–15)
BUN: 10 mg/dL (ref 6–23)
CO2: 24 mEq/L (ref 19–32)
Calcium: 8.5 mg/dL (ref 8.4–10.5)
Chloride: 106 mEq/L (ref 96–112)
Creatinine, Ser: 0.99 mg/dL (ref 0.50–1.10)
GFR calc non Af Amer: 74 mL/min — ABNORMAL LOW (ref >90)
GFR, EST AFRICAN AMERICAN: 86 mL/min — AB (ref >90)
Glucose, Bld: 118 mg/dL — ABNORMAL HIGH (ref 70–99)
POTASSIUM: 4.5 meq/L (ref 3.7–5.3)
Sodium: 141 mEq/L (ref 137–147)

## 2014-02-08 LAB — CBC
HEMATOCRIT: 42.5 % (ref 36.0–46.0)
Hemoglobin: 14 g/dL (ref 12.0–15.0)
MCH: 27.9 pg (ref 26.0–34.0)
MCHC: 32.9 g/dL (ref 30.0–36.0)
MCV: 84.7 fL (ref 78.0–100.0)
Platelets: 222 10*3/uL (ref 150–400)
RBC: 5.02 MIL/uL (ref 3.87–5.11)
RDW: 13.6 % (ref 11.5–15.5)
WBC: 5.3 10*3/uL (ref 4.0–10.5)

## 2014-02-08 LAB — I-STAT TROPONIN, ED: Troponin i, poc: 0.01 ng/mL (ref 0.00–0.08)

## 2014-02-08 MED ORDER — SODIUM CHLORIDE 0.9 % IV BOLUS (SEPSIS)
1000.0000 mL | Freq: Once | INTRAVENOUS | Status: AC
  Administered 2014-02-08: 1000 mL via INTRAVENOUS

## 2014-02-08 MED ORDER — ONDANSETRON 4 MG PO TBDP
4.0000 mg | ORAL_TABLET | Freq: Once | ORAL | Status: AC
  Administered 2014-02-08: 4 mg via ORAL
  Filled 2014-02-08: qty 1

## 2014-02-08 MED ORDER — MECLIZINE HCL 25 MG PO TABS
25.0000 mg | ORAL_TABLET | Freq: Once | ORAL | Status: AC
  Administered 2014-02-08: 25 mg via ORAL
  Filled 2014-02-08: qty 1

## 2014-02-08 MED ORDER — MECLIZINE HCL 25 MG PO TABS: 25.0000 mg | ORAL_TABLET | Freq: Three times a day (TID) | ORAL | Status: DC | PRN

## 2014-02-08 NOTE — Discharge Instructions (Signed)
Follow up with your neurologist Monday as scheduled.  Syncope Syncope is a medical term for fainting or passing out. This means you lose consciousness and drop to the ground. People are generally unconscious for less than 5 minutes. You may have some muscle twitches for up to 15 seconds before waking up and returning to normal. Syncope occurs more often in older adults, but it can happen to anyone. While most causes of syncope are not dangerous, syncope can be a sign of a serious medical problem. It is important to seek medical care.  CAUSES  Syncope is caused by a sudden drop in blood flow to the brain. The specific cause is often not determined. Factors that can bring on syncope include:  Taking medicines that lower blood pressure.  Sudden changes in posture, such as standing up quickly.  Taking more medicine than prescribed.  Standing in one place for too long.  Seizure disorders.  Dehydration and excessive exposure to heat.  Low blood sugar (hypoglycemia).  Straining to have a bowel movement.  Heart disease, irregular heartbeat, or other circulatory problems.  Fear, emotional distress, seeing blood, or severe pain. SYMPTOMS  Right before fainting, you may:  Feel dizzy or light-headed.  Feel nauseous.  See all white or all black in your field of vision.  Have cold, clammy skin. DIAGNOSIS  Your health care provider will ask about your symptoms, perform a physical exam, and perform an electrocardiogram (ECG) to record the electrical activity of your heart. Your health care provider may also perform other heart or blood tests to determine the cause of your syncope which may include:  Transthoracic echocardiogram (TTE). During echocardiography, sound waves are used to evaluate how blood flows through your heart.  Transesophageal echocardiogram (TEE).  Cardiac monitoring. This allows your health care provider to monitor your heart rate and rhythm in real time.  Holter  monitor. This is a portable device that records your heartbeat and can help diagnose heart arrhythmias. It allows your health care provider to track your heart activity for several days, if needed.  Stress tests by exercise or by giving medicine that makes the heart beat faster. TREATMENT  In most cases, no treatment is needed. Depending on the cause of your syncope, your health care provider may recommend changing or stopping some of your medicines. HOME CARE INSTRUCTIONS  Have someone stay with you until you feel stable.  Do not drive, use machinery, or play sports until your health care provider says it is okay.  Keep all follow-up appointments as directed by your health care provider.  Lie down right away if you start feeling like you might faint. Breathe deeply and steadily. Wait until all the symptoms have passed.  Drink enough fluids to keep your urine clear or pale yellow.  If you are taking blood pressure or heart medicine, get up slowly and take several minutes to sit and then stand. This can reduce dizziness. SEEK IMMEDIATE MEDICAL CARE IF:   You have a severe headache.  You have unusual pain in the chest, abdomen, or back.  You are bleeding from your mouth or rectum, or you have black or tarry stool.  You have an irregular or very fast heartbeat.  You have pain with breathing.  You have repeated fainting or seizure-like jerking during an episode.  You faint when sitting or lying down.  You have confusion.  You have trouble walking.  You have severe weakness.  You have vision problems. If you fainted, call your  local emergency services (911 in [place] Do not drive yourself to the hospital.  MAKE SURE YOU:  Understand these instructions.  Will watch your condition.  Will get help right away if you are not doing well or get worse. Document Released: 03/14/2005 Document Revised: 03/19/2013 Document Reviewed: 05/13/2011 [facility] Patient Information 2015  [place], [place]. This information is not intended to replace advice given to you by your health care provider. Make sure you discuss any questions you have with your health care provider.  Dizziness Dizziness is a common problem. It is a feeling of unsteadiness or light-headedness. You may feel like you are about to faint. Dizziness can lead to injury if you stumble or fall. A person of any age group can suffer from dizziness, but dizziness is more common in older adults. CAUSES  Dizziness can be caused by many different things, including:  Middle ear problems.  Standing for too long.  Infections.  An allergic reaction.  Aging.  An emotional response to something, such as the sight of blood.  Side effects of medicines.  Tiredness.  Problems with circulation or blood pressure.  Excessive use of alcohol or medicines, or illegal drug use.  Breathing too fast (hyperventilation).  An irregular heart rhythm (arrhythmia).  A low red blood cell count (anemia).  Pregnancy.  Vomiting, diarrhea, fever, or other illnesses that cause body fluid loss (dehydration).  Diseases or conditions such as Parkinson's disease, high blood pressure (hypertension), diabetes, and thyroid problems.  Exposure to extreme heat. DIAGNOSIS  Your health care provider will ask about your symptoms, perform a physical exam, and perform an electrocardiogram (ECG) to record the electrical activity of your heart. Your health care provider may also perform other heart or blood tests to determine the cause of your dizziness. These may include:  Transthoracic echocardiogram (TTE). During echocardiography, sound waves are used to evaluate how blood flows through your heart.  Transesophageal echocardiogram (TEE).  Cardiac monitoring. This allows your health care provider to monitor your heart rate and rhythm in real time.  Holter monitor. This is a portable device that records your heartbeat and can help diagnose  heart arrhythmias. It allows your health care provider to track your heart activity for several days if needed.  Stress tests by exercise or by giving medicine that makes the heart beat faster. TREATMENT  Treatment of dizziness depends on the cause of your symptoms and can vary greatly. HOME CARE INSTRUCTIONS   Drink enough fluids to keep your urine clear or pale yellow. This is especially important in very hot weather. In older adults, it is also important in cold weather.  Take your medicine exactly as directed if your dizziness is caused by medicines. When taking blood pressure medicines, it is especially important to get up slowly.  Rise slowly from chairs and steady yourself until you feel okay.  In the morning, first sit up on the side of the bed. When you feel okay, stand slowly while holding onto something until you know your balance is fine.  Move your legs often if you need to stand in one place for a long time. Tighten and relax your muscles in your legs while standing.  Have someone stay with you for 1-2 days if dizziness continues to be a problem. Do this until you feel you are well enough to stay alone. Have the person call your health care provider if he or she notices changes in you that are concerning.  Do not drive or use heavy machinery  if you feel dizzy.  Do not drink alcohol. SEEK IMMEDIATE MEDICAL CARE IF:   Your dizziness or light-headedness gets worse.  You feel nauseous or vomit.  You have problems talking, walking, or using your arms, hands, or legs.  You feel weak.  You are not thinking clearly or you have trouble forming sentences. It may take a friend or family member to notice this.  You have chest pain, abdominal pain, shortness of breath, or sweating.  Your vision changes.  You notice any bleeding.  You have side effects from medicine that seems to be getting worse rather than better. MAKE SURE YOU:   Understand these instructions.  Will  watch your condition.  Will get help right away if you are not doing well or get worse. Document Released: 09/07/2000 Document Revised: 03/19/2013 Document Reviewed: 10/01/2010 [facility] Patient Information 2015 [place], [place]. This information is not intended to replace advice given to you by your health care provider. Make sure you discuss any questions you have with your health care provider.

## 2014-02-08 NOTE — ED Provider Notes (Signed)
CSN: [id number]     Arrival date & time 02/08/14  1819 History   First MD Initiated Contact with Patient 02/08/14 1821     Chief Complaint  Patient presents with  . Loss of Consciousness     (Consider location/radiation/quality/duration/timing/severity/associated sxs/prior Treatment) HPI Comments: This is a 33 year old female with a past medical history of trigeminal neuralgia who presents to the emergency department via EMS after having a syncopal episode. Patient reports she was shopping at [employer] when she bent forward to get something from a bottom drawer, started to feel dizzy, stood up and started to fall backwards. She fell into her sister's arms, she was then laid on the floor. She reports the next thing she knew she was laying on the floor. She states earlier in the day she was slightly nauseated, otherwise felt fine. Currently she is feeling weak and dizzy as if the room is spinning, along with the nausea. No vomiting. Denies fevers, chills, chest pain, shortness of breath, vision changes, confusion, numbness or extremity weakness. She reports this is her third syncopal episode since August when she was started on Keppra for trigeminal neuralgia. She was seen in the emergency department on October 28 for similar syncopal episode. At that time she had labs and urinalysis, all without any acute findings.  Patient is a 33 y.o. female presenting with syncope. The history is provided by the patient and the EMS personnel.  Loss of Consciousness Associated symptoms: dizziness, nausea and weakness     Past Medical History  Diagnosis Date  . Nerve disorder   . Trigeminal nerve disease    History reviewed. No pertinent past surgical history. No family history on file. History  Substance Use Topics  . Smoking status: Never Smoker   . Smokeless tobacco: Not on file  . Alcohol Use: Yes     Comment: occ   OB History    No data available     Review of Systems   Cardiovascular: Positive for syncope.  Gastrointestinal: Positive for nausea.  Neurological: Positive for dizziness and weakness.  All other systems reviewed and are negative.    Allergies  Carbamazepine and Dilantin  Home Medications   Prior to Admission medications   Medication Sig Start Date End Date Taking? Authorizing Provider  levETIRAcetam (KEPPRA XR) 500 MG 24 hr tablet Take 500-1,000 mg by mouth 2 (two) times daily. 500mg  in the morning and 1000mg  in the evening.   Yes Historical Provider, MD  Multiple Vitamin (MULTIVITAMIN WITH MINERALS) TABS tablet Take 1 tablet by mouth daily.   Yes Historical Provider, MD   BP 102/54 mmHg  Pulse 79  Temp(Src) 97.9 F (36.6 C) (Oral)  Resp 12  SpO2 100%  LMP 01/18/2014 Physical Exam  Constitutional: She is oriented to person, place, and time. She appears well-developed and well-nourished. No distress.  HENT:  Head: Normocephalic and atraumatic.  Mouth/Throat: Oropharynx is clear and moist.  Eyes: Conjunctivae and EOM are normal. Pupils are equal, round, and reactive to light.  Neck: Normal range of motion. Neck supple. No JVD present.  Cardiovascular: Normal rate, regular rhythm, normal heart sounds and intact distal pulses.   No extremity edema.  Pulmonary/Chest: Effort normal and breath sounds normal. No respiratory distress.  Abdominal: Soft. Bowel sounds are normal. There is no tenderness.  Musculoskeletal: Normal range of motion. She exhibits no edema.  Neurological: She is alert and oriented to person, place, and time. She has normal strength. No cranial nerve deficit or sensory deficit.  Coordination and gait normal.  Speech fluent, goal oriented. Moves limbs without ataxia. Equal grip strength bilateral.  Skin: Skin is warm and dry. No rash noted. She is not diaphoretic.  Psychiatric: She has a normal mood and affect. Her behavior is normal.  Nursing note and vitals reviewed.   ED Course  Procedures (including  critical care time) Labs Review Labs Reviewed  BASIC METABOLIC PANEL - Abnormal; Notable for the following:    Glucose, Bld 118 (*)    GFR calc non Af Amer 74 (*)    GFR calc Af Amer 86 (*)    All other components within normal limits  CBC  I-STAT TROPOININ, ED  POC URINE PREG, ED    Imaging Review Ct Head Wo Contrast  02/08/2014   CLINICAL DATA:  Syncopal episode today initial evaluation third time she passed out this month no headache  EXAM: CT HEAD WITHOUT CONTRAST  TECHNIQUE: Contiguous axial images were obtained from the base of the skull through the vertex without intravenous contrast.  COMPARISON:  None.  FINDINGS: No mass lesion. No midline shift. No acute hemorrhage or hematoma. No extra-axial fluid collections. No evidence of acute infarction. Calvarium intact. No significant inflammatory change in the visualized portions of the paranasal sinuses.  IMPRESSION: No acute finding   Electronically Signed   By: Skipper Cliche M.D.   On: 02/08/2014 20:35     EKG Interpretation   Date/Time:  Saturday February 08 2014 18:31:29 EST Ventricular Rate:  84 PR Interval:  130 QRS Duration: 74 QT Interval:  363 QTC Calculation: 429 R Axis:   49 Text Interpretation:  Sinus rhythm ST elev, probable normal early repol  pattern No significant change since last tracing Confirmed by [place] ([id number]) on 02/08/2014 7:21:06 PM      MDM   Final diagnoses:  Syncope   Patient presenting after a syncopal episode. This is her third syncopal episode in the past 4 months. It is possible that the syncope is a side effect of the Keppra, however given she's had no imaging throughout her single episodes, will obtain a head CT. Plan to also obtain labs, orthostatic vital signs and give IV fluids. Blood pressure 93/55. Vitals otherwise stable. No focal neurologic deficits. Doubt cardiac syncope. 8:51 PM Workup negative. Blood pressure improved. Vitals remain stable. Patient reports she is  feeling better after receiving meclizine and Zofran. She has an appt Monday with her neurologist at [facility] neurology. She is stable for d/c. Will d/c home with meclizine. Return precautions given. Patient states understanding of treatment care plan and is agreeable.  Carman Ching, PA-C 02/08/14 2054  Ephraim Hamburger, MD 02/09/14 [phone]

## 2014-02-08 NOTE — ED Notes (Addendum)
Per EMS, pt had a syncopal episode while standing. Pt did lose consciousness but did not hit her head. EMS reported that the pt also had orthostatic changes. CBG: 109. BP: 98/72 Pt alert x4. NAD at this time. Pt gave plasma today.

## 2014-02-08 NOTE — ED Notes (Signed)
To CT

## 2014-03-30 ENCOUNTER — Encounter (HOSPITAL_COMMUNITY): Payer: Self-pay

## 2014-03-30 ENCOUNTER — Emergency Department (HOSPITAL_COMMUNITY)
Admission: EM | Admit: 2014-03-30 | Discharge: 2014-03-30 | Disposition: A | Discharge status: Home / Self Care | Payer: No Typology Code available for payment source | Attending: Emergency Medicine | Admitting: Emergency Medicine

## 2014-03-30 DIAGNOSIS — R110 Nausea: Secondary | ICD-10-CM | POA: Insufficient documentation

## 2014-03-30 DIAGNOSIS — Z3202 Encounter for pregnancy test, result negative: Secondary | ICD-10-CM | POA: Insufficient documentation

## 2014-03-30 DIAGNOSIS — R55 Syncope and collapse: Secondary | ICD-10-CM | POA: Insufficient documentation

## 2014-03-30 DIAGNOSIS — Z79899 Other long term (current) drug therapy: Secondary | ICD-10-CM | POA: Insufficient documentation

## 2014-03-30 DIAGNOSIS — Z8659 Personal history of other mental and behavioral disorders: Secondary | ICD-10-CM | POA: Insufficient documentation

## 2014-03-30 LAB — URINE MICROSCOPIC-ADD ON

## 2014-03-30 LAB — URINALYSIS, ROUTINE W REFLEX MICROSCOPIC
BILIRUBIN URINE: NEGATIVE
Glucose, UA: NEGATIVE mg/dL
Hgb urine dipstick: NEGATIVE
Ketones, ur: NEGATIVE mg/dL
Nitrite: NEGATIVE
PH: 6 (ref 5.0–8.0)
Protein, ur: NEGATIVE mg/dL
SPECIFIC GRAVITY, URINE: 1.008 (ref 1.005–1.030)
Urobilinogen, UA: 0.2 mg/dL (ref 0.0–1.0)

## 2014-03-30 LAB — PREGNANCY, URINE: Preg Test, Ur: NEGATIVE

## 2014-03-30 NOTE — Discharge Instructions (Signed)
Stay well-hydrated, feet feel lightheaded sit yourself down to help avoiding passing out. Stand up slowly. Follow-up with previous cardiologist, if you are unable to follow-up you can follow-up with [facility].  If you were given medicines take as directed.  If you are on coumadin or contraceptives realize their levels and effectiveness is altered by many different medicines.  If you have any reaction (rash, tongues swelling, other) to the medicines stop taking and see a physician.   Please follow up as directed and return to the ER or see a physician for new or worsening symptoms.  Thank you. Filed Vitals:   03/30/14 1433 03/30/14 1434 03/30/14 1510  BP: 105/70  116/55  Pulse: 80  89  Temp: 97.8 F (36.6 C)    Resp: 16  16  SpO2: 100% 98% 100%    Near-Syncope Near-syncope (commonly known as near fainting) is sudden weakness, dizziness, or feeling like you might pass out. During an episode of near-syncope, you may also develop pale skin, have tunnel vision, or feel sick to your stomach (nauseous). Near-syncope may occur when getting up after sitting or while standing for a long time. It is caused by a sudden decrease in blood flow to the brain. This decrease can result from various causes or triggers, most of which are not serious. However, because near-syncope can sometimes be a sign of something serious, a medical evaluation is required. The specific cause is often not determined. HOME CARE INSTRUCTIONS  Monitor your condition for any changes. The following actions may help to alleviate any discomfort you are experiencing:  Have someone stay with you until you feel stable.  Lie down right away and prop your feet up if you start feeling like you might faint. Breathe deeply and steadily. Wait until all the symptoms have passed. Most of these episodes last only a few minutes. You may feel tired for several hours.   Drink enough fluids to keep your urine clear or pale yellow.   If you  are taking blood pressure or heart medicine, get up slowly when seated or lying down. Take several minutes to sit and then stand. This can reduce dizziness.  Follow up with your health care provider as directed. SEEK IMMEDIATE MEDICAL CARE IF:   You have a severe headache.   You have unusual pain in the chest, abdomen, or back.   You are bleeding from the mouth or rectum, or you have black or tarry stool.   You have an irregular or very fast heartbeat.   You have repeated fainting or have seizure-like jerking during an episode.   You faint when sitting or lying down.   You have confusion.   You have difficulty walking.   You have severe weakness.   You have vision problems.  MAKE SURE YOU:   Understand these instructions.  Will watch your condition.  Will get help right away if you are not doing well or get worse. Document Released: 03/14/2005 Document Revised: 03/19/2013 Document Reviewed: 08/17/2012 [facility] Patient Information 2015 [place], [place]. This information is not intended to replace advice given to you by your health care provider. Make sure you discuss any questions you have with your health care provider.

## 2014-03-30 NOTE — ED Notes (Signed)
Pt stated that she did not have to void at this time.

## 2014-03-30 NOTE — ED Provider Notes (Signed)
CSN: [id number]     Arrival date & time 03/30/14  1433 History   First MD Initiated Contact with Patient 03/30/14 1501     Chief Complaint  Patient presents with  . Near Syncope     (Consider location/radiation/quality/duration/timing/severity/associated sxs/prior Treatment) HPI Comments: 34 year old female with obesity and nerve disorder history on Keppra presents after near syncopal episode at church. Patient was standing holding a tray and felt lightheaded and nauseated than on the past out. Patient recalls events, no headache injury. Patient has had a few of these in the past months, currently has a monitor and is following up with cardiology. Patient denies any headache chest pain returns of breath.Patient denies blood clot history, active cancer, recent major trauma or surgery, unilateral leg swelling/ pain, recent long travel, hemoptysis or oral contraceptives. Patient feels well currently, patient ate breakfast this morning and had a bowel water.Denies bleeding or fh sudden death, no cardiac hx.  Patient is a 34 y.o. female presenting with near-syncope. The history is provided by the patient.  Near Syncope Pertinent negatives include no chest pain, no abdominal pain, no headaches and no shortness of breath.    Past Medical History  Diagnosis Date  . Nerve disorder   . Trigeminal nerve disease    History reviewed. No pertinent past surgical history. No family history on file. History  Substance Use Topics  . Smoking status: Never Smoker   . Smokeless tobacco: Not on file  . Alcohol Use: Yes     Comment: occ   OB History    No data available     Review of Systems  Constitutional: Negative for fever and chills.  HENT: Negative for congestion.   Eyes: Negative for visual disturbance.  Respiratory: Negative for shortness of breath.   Cardiovascular: Positive for near-syncope. Negative for chest pain.  Gastrointestinal: Negative for vomiting, abdominal pain and blood in  stool.  Genitourinary: Negative for dysuria and flank pain.  Musculoskeletal: Negative for back pain, neck pain and neck stiffness.  Skin: Negative for rash.  Neurological: Positive for syncope and light-headedness. Negative for weakness, numbness and headaches.      Allergies  Carbamazepine and Dilantin  Home Medications   Prior to Admission medications   Medication Sig Start Date End Date Taking? Authorizing Provider  levETIRAcetam (KEPPRA XR) 500 MG 24 hr tablet Take 500-1,000 mg by mouth 2 (two) times daily. 500mg  in the morning and 1000mg  in the evening.   Yes Historical Provider, MD  Multiple Vitamin (MULTIVITAMIN WITH MINERALS) TABS tablet Take 1 tablet by mouth daily.   Yes Historical Provider, MD  meclizine (ANTIVERT) 25 MG tablet Take 1 tablet (25 mg total) by mouth 3 (three) times daily as needed. Patient not taking: Reported on 03/30/2014 02/08/14   Robyn M Hess, PA-C   BP 116/55 mmHg  Pulse 89  Temp(Src) 97.8 F (36.6 C)  Resp 16  SpO2 100% Physical Exam  Constitutional: She is oriented to person, place, and time. She appears well-developed and well-nourished.  HENT:  Head: Normocephalic and atraumatic.  Mild dry mm  Eyes: Conjunctivae are normal. Right eye exhibits no discharge. Left eye exhibits no discharge.  Neck: Normal range of motion. Neck supple. No tracheal deviation present.  Cardiovascular: Normal rate and regular rhythm.   Pulmonary/Chest: Effort normal and breath sounds normal.  Abdominal: Soft. She exhibits no distension. There is no tenderness. There is no guarding.  Musculoskeletal: She exhibits no edema.  Neurological: She is alert and oriented to person,  place, and time. No cranial nerve deficit.  Skin: Skin is warm. No rash noted.  Psychiatric: She has a normal mood and affect.  Nursing note and vitals reviewed.   ED Course  Procedures (including critical care time) Labs Review Labs Reviewed  URINALYSIS, ROUTINE W REFLEX MICROSCOPIC -  Abnormal; Notable for the following:    APPearance CLOUDY (*)    Leukocytes, UA TRACE (*)    All other components within normal limits  URINE MICROSCOPIC-ADD ON - Abnormal; Notable for the following:    Squamous Epithelial / LPF FEW (*)    All other components within normal limits  PREGNANCY, URINE    Imaging Review No results found.   EKG Interpretation   Date/Time:  Sunday March 30 2014 15:09:56 EST Ventricular Rate:  78 PR Interval:  126 QRS Duration: 81 QT Interval:  381 QTC Calculation: 434 R Axis:   57 Text Interpretation:  Age not entered, assumed to be  34 years old for  purpose of ECG interpretation Sinus rhythm ST elev, probable normal early  repol pattern similar to previous Confirmed by Alante Weimann  MD, [place] ([id number])  on 03/30/2014 3:21:14 PM      MDM   Final diagnoses:  Syncope and collapse   34 year old female currently being worked up outpatient for recurrent lightheadedness and syncope presents are similar event. History not consistent with cardiac as patient was standing at church and felt lightheaded nauseated prior. EKG reviewed similar to previous early repolarization. Normal QT. Patient feels well currently no seizure activity witnessed. Plan for urinalysis, orthostatics, oral fluids and continued outpatient follow-up. Pt well appearing on recheck, no sxs, fup outpt discussed  Results and differential diagnosis were discussed with the patient/parent/guardian. Close follow up outpatient was discussed, comfortable with the plan.   Medications - No data to display  Filed Vitals:   03/30/14 1433 03/30/14 1434 03/30/14 1510  BP: 105/70  116/55  Pulse: 80  89  Temp: 97.8 F (36.6 C)    Resp: 16  16  SpO2: 100% 98% 100%    Final diagnoses:  Syncope and collapse        Mariea Clonts, MD 03/30/14 1646

## 2014-03-30 NOTE — ED Notes (Signed)
Pt with near syncopal episodes for several months.  Pt has been prescribed different meds for facial pain.  Pt was put on Keppra in August for facial pain.  Pt had near syncopal episode at church today.  Pt states she felt light headed and dizzy.  EMS reports same call last Sunday, but pt was not transported.  Pt VSS and WDL.

## 2015-02-07 ENCOUNTER — Encounter (HOSPITAL_COMMUNITY): Payer: Self-pay | Admitting: *Deleted

## 2015-02-07 ENCOUNTER — Emergency Department (INDEPENDENT_AMBULATORY_CARE_PROVIDER_SITE_OTHER)
Admission: EM | Admit: 2015-02-07 | Discharge: 2015-02-07 | Disposition: A | Discharge status: Home / Self Care | Payer: 59 | Source: Home / Self Care | Attending: Family Medicine | Admitting: Family Medicine

## 2015-02-07 DIAGNOSIS — J069 Acute upper respiratory infection, unspecified: Secondary | ICD-10-CM | POA: Diagnosis not present

## 2015-02-07 MED ORDER — AZITHROMYCIN 250 MG PO TABS: ORAL_TABLET | ORAL | Status: DC

## 2015-02-07 MED ORDER — IPRATROPIUM BROMIDE 0.06 % NA SOLN: 2.0000 | Freq: Four times a day (QID) | NASAL | Status: DC

## 2015-02-07 NOTE — ED Provider Notes (Signed)
CSN: [id number]     Arrival date & time 02/07/15  1344 History   First MD Initiated Contact with Patient 02/07/15 1538     Chief Complaint  Patient presents with  . Cough   (Consider location/radiation/quality/duration/timing/severity/associated sxs/prior Treatment) Patient is a 34 y.o. female presenting with cough. The history is provided by the patient.  Cough Cough characteristics:  Non-productive, dry and harsh Severity:  Mild Onset quality:  Gradual Duration:  5 days Progression:  Worsening Chronicity:  New Smoker: no   Context: upper respiratory infection   Associated symptoms: rhinorrhea and sore throat   Associated symptoms: no fever     Past Medical History  Diagnosis Date  . Nerve disorder   . Trigeminal nerve disease    History reviewed. No pertinent past surgical history. History reviewed. No pertinent family history. Social History  Substance Use Topics  . Smoking status: Never Smoker   . Smokeless tobacco: None  . Alcohol Use: Yes     Comment: occ   OB History    No data available     Review of Systems  Constitutional: Negative.  Negative for fever.  HENT: Positive for congestion, postnasal drip, rhinorrhea, sinus pressure and sore throat.   Eyes: Negative.   Respiratory: Positive for cough.   Cardiovascular: Negative.   Gastrointestinal: Negative.   Musculoskeletal: Negative.   Skin: Negative.   All other systems reviewed and are negative.   Allergies  Carbamazepine and Dilantin  Home Medications   Prior to Admission medications   Medication Sig Start Date End Date Taking? Authorizing Provider  azithromycin (ZITHROMAX Z-PAK) 250 MG tablet Take as directed on pack 02/07/15   Billy Fischer, MD  ipratropium (ATROVENT) 0.06 % nasal spray Place 2 sprays into both nostrils 4 (four) times daily. 02/07/15   Billy Fischer, MD  levETIRAcetam (KEPPRA XR) 500 MG 24 hr tablet Take 500-1,000 mg by mouth 2 (two) times daily. 500mg  in the morning and  1000mg  in the evening.    Historical Provider, MD  meclizine (ANTIVERT) 25 MG tablet Take 1 tablet (25 mg total) by mouth 3 (three) times daily as needed. Patient not taking: Reported on 03/30/2014 02/08/14   Carman Ching, PA-C  Multiple Vitamin (MULTIVITAMIN WITH MINERALS) TABS tablet Take 1 tablet by mouth daily.    Historical Provider, MD   Meds Ordered and Administered this Visit  Medications - No data to display  BP 131/80 mmHg  Pulse 62  Temp(Src) 98.1 F (36.7 C) (Oral)  SpO2 100% No data found.   Physical Exam  Constitutional: She is oriented to person, place, and time. She appears well-nourished. No distress.  HENT:  Head: Normocephalic.  Right Ear: External ear normal.  Left Ear: External ear normal.  Mouth/Throat: Oropharynx is clear and moist.  Eyes: Conjunctivae are normal. Pupils are equal, round, and reactive to light.  Neck: Normal range of motion. Neck supple.  Cardiovascular: Normal heart sounds and intact distal pulses.   Pulmonary/Chest: Effort normal and breath sounds normal.  Lymphadenopathy:    She has no cervical adenopathy.  Neurological: She is alert and oriented to person, place, and time.  Skin: Skin is warm.  Nursing note and vitals reviewed.   ED Course  Procedures (including critical care time)  Labs Review Labs Reviewed - No data to display  Imaging Review No results found.   Visual Acuity Review  Right Eye Distance:   Left Eye Distance:   Bilateral Distance:    Right Eye  Near:   Left Eye Near:    Bilateral Near:         MDM   1. URI (upper respiratory infection)        Billy Fischer, MD 02/07/15 [phone]

## 2015-02-07 NOTE — ED Notes (Signed)
Symptoms  Of  Cough  /  Congested  With  Sinus  Drainage

## 2015-02-07 NOTE — Discharge Instructions (Signed)
Drink plenty of fluids as discussed, use medicine as prescribed, and mucinex or delsym for cough. Return or see your doctor if further problems °

## 2015-05-09 ENCOUNTER — Encounter (HOSPITAL_COMMUNITY): Payer: Self-pay | Admitting: Emergency Medicine

## 2015-05-09 ENCOUNTER — Emergency Department (INDEPENDENT_AMBULATORY_CARE_PROVIDER_SITE_OTHER)
Admission: EM | Admit: 2015-05-09 | Discharge: 2015-05-09 | Disposition: A | Discharge status: Home / Self Care | Payer: BC Managed Care – PPO | Source: Home / Self Care | Attending: Family Medicine | Admitting: Family Medicine

## 2015-05-09 ENCOUNTER — Other Ambulatory Visit (HOSPITAL_COMMUNITY)
Admission: RE | Admit: 2015-05-09 | Discharge: 2015-05-09 | Disposition: A | Discharge status: Home / Self Care | Payer: BC Managed Care – PPO | Source: Ambulatory Visit | Attending: Family Medicine | Admitting: Family Medicine

## 2015-05-09 DIAGNOSIS — J069 Acute upper respiratory infection, unspecified: Secondary | ICD-10-CM | POA: Diagnosis not present

## 2015-05-09 DIAGNOSIS — H6983 Other specified disorders of Eustachian tube, bilateral: Secondary | ICD-10-CM | POA: Diagnosis not present

## 2015-05-09 DIAGNOSIS — T700XXA Otitic barotrauma, initial encounter: Secondary | ICD-10-CM | POA: Diagnosis present

## 2015-05-09 DIAGNOSIS — R0982 Postnasal drip: Secondary | ICD-10-CM

## 2015-05-09 LAB — POCT RAPID STREP A: STREPTOCOCCUS, GROUP A SCREEN (DIRECT): NEGATIVE

## 2015-05-09 NOTE — ED Notes (Signed)
C/o cold sx onset x3 days associated w/ST, left ear pain, fevers A&O x4... No acute distress.

## 2015-05-09 NOTE — Discharge Instructions (Signed)
Barotitis Media Barotitis media is inflammation of your middle ear. This occurs when the auditory tube (eustachian tube) leading from the back of your nose (nasopharynx) to your eardrum is blocked. This blockage may result from a cold, environmental allergies, or an upper respiratory infection. Unresolved barotitis media may lead to damage or hearing loss (barotrauma), which may become permanent. HOME CARE INSTRUCTIONS   Use medicines as recommended by your health care provider. Over-the-counter medicines will help unblock the canal and can help during times of air travel.  Do not put anything into your ears to clean or unplug them. Eardrops will not be helpful.  Do not swim, dive, or fly until your health care provider says it is all right to do so. If these activities are necessary, chewing gum with frequent, forceful swallowing may help. It is also helpful to hold your nose and gently blow to pop your ears for equalizing pressure changes. This forces air into the eustachian tube.  Only take over-the-counter or prescription medicines for pain, discomfort, or fever as directed by your health care provider.  A decongestant may be helpful in decongesting the middle ear and make pressure equalization easier. SEEK MEDICAL CARE IF:  You experience a serious form of dizziness in which you feel as if the room is spinning and you feel nauseated (vertigo).  Your symptoms only involve one ear. SEEK IMMEDIATE MEDICAL CARE IF:   You develop a severe headache, dizziness, or severe ear pain.  You have bloody or pus-like drainage from your ears.  You develop a fever.  Your problems do not improve or become worse. MAKE SURE YOU:   Understand these instructions.  Will watch your condition.  Will get help right away if you are not doing well or get worse.   This information is not intended to replace advice given to you by your health care provider. Make sure you discuss any questions you have with  your health care provider.   Document Released: 03/11/2000 Document Revised: 01/02/2013 Document Reviewed: 10/09/2012 Elsevier Interactive Patient Education 2016 [place].  Upper Respiratory Infection, Adult For nasal and head congestion may take Sudafed PE 10 mg every 4 hours as needed. Saline nasal spray used frequently. For drainage may use Allegra, Claritin or Zyrtec. If you need stronger medicine to stop drainage may take Chlor-Trimeton 2-4 mg every 4 hours. This may cause drowsiness. Ibuprofen 600 mg every 6 hours as needed for pain, discomfort or fever. Drink plenty of fluids and stay well-hydrated.  Most upper respiratory infections (URIs) are a viral infection of the air passages leading to the lungs. A URI affects the nose, throat, and upper air passages. The most common type of URI is nasopharyngitis and is typically referred to as "the common cold." URIs run their course and usually go away on their own. Most of the time, a URI does not require medical attention, but sometimes a bacterial infection in the upper airways can follow a viral infection. This is called a secondary infection. Sinus and middle ear infections are common types of secondary upper respiratory infections. Bacterial pneumonia can also complicate a URI. A URI can worsen asthma and chronic obstructive pulmonary disease (COPD). Sometimes, these complications can require emergency medical care and may be life threatening.  CAUSES Almost all URIs are caused by viruses. A virus is a type of germ and can spread from one person to another.  RISKS FACTORS You may be at risk for a URI if:   You smoke.  You have chronic heart or lung disease.  You have a weakened defense (immune) system.   You are very young or very old.   You have nasal allergies or asthma.  You work in crowded or poorly ventilated areas.  You work in health care facilities or schools. SIGNS AND SYMPTOMS  Symptoms typically develop 2-3  days after you come in contact with a cold virus. Most viral URIs last 7-10 days. However, viral URIs from the influenza virus (flu virus) can last 14-18 days and are typically more severe. Symptoms may include:   Runny or stuffy (congested) nose.   Sneezing.   Cough.   Sore throat.   Headache.   Fatigue.   Fever.   Loss of appetite.   Pain in your forehead, behind your eyes, and over your cheekbones (sinus pain).  Muscle aches.  DIAGNOSIS  Your health care provider may diagnose a URI by:  Physical exam.  Tests to check that your symptoms are not due to another condition such as:  Strep throat.  Sinusitis.  Pneumonia.  Asthma. TREATMENT  A URI goes away on its own with time. It cannot be cured with medicines, but medicines may be prescribed or recommended to relieve symptoms. Medicines may help:  Reduce your fever.  Reduce your cough.  Relieve nasal congestion. HOME CARE INSTRUCTIONS   Take medicines only as directed by your health care provider.   Gargle warm saltwater or take cough drops to comfort your throat as directed by your health care provider.  Use a warm mist humidifier or inhale steam from a shower to increase air moisture. This may make it easier to breathe.  Drink enough fluid to keep your urine clear or pale yellow.   Eat soups and other clear broths and maintain good nutrition.   Rest as needed.   Return to work when your temperature has returned to normal or as your health care provider advises. You may need to stay home longer to avoid infecting others. You can also use a face mask and careful hand washing to prevent spread of the virus.  Increase the usage of your inhaler if you have asthma.   Do not use any tobacco products, including cigarettes, chewing tobacco, or electronic cigarettes. If you need help quitting, ask your health care provider. PREVENTION  The best way to protect yourself from getting a cold is to  practice good hygiene.   Avoid oral or hand contact with people with cold symptoms.   Wash your hands often if contact occurs.  There is no clear evidence that vitamin C, vitamin E, echinacea, or exercise reduces the chance of developing a cold. However, it is always recommended to get plenty of rest, exercise, and practice good nutrition.  SEEK MEDICAL CARE IF:   You are getting worse rather than better.   Your symptoms are not controlled by medicine.   You have chills.  You have worsening shortness of breath.  You have brown or red mucus.  You have yellow or brown nasal discharge.  You have pain in your face, especially when you bend forward.  You have a fever.  You have swollen neck glands.  You have pain while swallowing.  You have white areas in the back of your throat. SEEK IMMEDIATE MEDICAL CARE IF:   You have severe or persistent:  Headache.  Ear pain.  Sinus pain.  Chest pain.  You have chronic lung disease and any of the following:  Wheezing.  Prolonged cough.  Coughing up blood.  A change in your usual mucus.  You have a stiff neck.  You have changes in your:  Vision.  Hearing.  Thinking.  Mood. MAKE SURE YOU:   Understand these instructions.  Will watch your condition.  Will get help right away if you are not doing well or get worse.   This information is not intended to replace advice given to you by your health care provider. Make sure you discuss any questions you have with your health care provider.   Document Released: 09/07/2000 Document Revised: 07/29/2014 Document Reviewed: 06/19/2013 Elsevier Interactive Patient Education [employer].

## 2015-05-09 NOTE — ED Provider Notes (Signed)
CSN: [id number]     Arrival date & time 05/09/15  1912 History   First MD Initiated Contact with Patient 05/09/15 1958     Chief Complaint  Patient presents with  . URI   (Consider location/radiation/quality/duration/timing/severity/associated sxs/prior Treatment) HPI Comments: 35 year old morbidly obese female complaining of a sore throat, earaches and fever for 2 days. He states last night her temp was 102. At this time she is afebrile.  Patient is a 35 y.o. female presenting with URI.  URI Presenting symptoms: ear pain, fever and sore throat   Presenting symptoms: no congestion, no cough, no fatigue and no rhinorrhea   Associated symptoms: no neck pain and no wheezing     Past Medical History  Diagnosis Date  . Nerve disorder   . Trigeminal nerve disease    History reviewed. No pertinent past surgical history. No family history on file. Social History  Substance Use Topics  . Smoking status: Never Smoker   . Smokeless tobacco: None  . Alcohol Use: Yes     Comment: occ   OB History    No data available     Review of Systems  Constitutional: Positive for fever and activity change. Negative for chills, appetite change and fatigue.  HENT: Positive for ear pain and sore throat. Negative for congestion, facial swelling, postnasal drip and rhinorrhea.   Eyes: Negative.   Respiratory: Negative.  Negative for cough and wheezing.   Cardiovascular: Negative.   Musculoskeletal: Negative for neck pain and neck stiffness.  Skin: Negative for pallor and rash.  Neurological: Negative.     Allergies  Carbamazepine and Dilantin  Home Medications   Prior to Admission medications   Medication Sig Start Date End Date Taking? Authorizing Provider  azithromycin (ZITHROMAX Z-PAK) 250 MG tablet Take as directed on pack 02/07/15   Billy Fischer, MD  ipratropium (ATROVENT) 0.06 % nasal spray Place 2 sprays into both nostrils 4 (four) times daily. 02/07/15   Billy Fischer, MD   levETIRAcetam (KEPPRA XR) 500 MG 24 hr tablet Take 500-1,000 mg by mouth 2 (two) times daily. 500mg  in the morning and 1000mg  in the evening.    Historical Provider, MD  meclizine (ANTIVERT) 25 MG tablet Take 1 tablet (25 mg total) by mouth 3 (three) times daily as needed. Patient not taking: Reported on 03/30/2014 02/08/14   Carman Ching, PA-C  Multiple Vitamin (MULTIVITAMIN WITH MINERALS) TABS tablet Take 1 tablet by mouth daily.    Historical Provider, MD   Meds Ordered and Administered this Visit  Medications - No data to display  BP 141/85 mmHg  Pulse 98  Temp(Src) 98.3 F (36.8 C) (Oral)  Resp 73  SpO2 100%  LMP 04/12/2015 No data found.   Physical Exam  Constitutional: She is oriented to person, place, and time. She appears well-developed and well-nourished. No distress.  HENT:  Mouth/Throat: No oropharyngeal exudate.  Bilateral TMs are retracted. Minor erythema. Oropharynx with erythema and copious amount of clear PND, cobblestoning and injection.  Eyes: Conjunctivae and EOM are normal.  Neck: Normal range of motion. Neck supple.  Cardiovascular: Normal rate, regular rhythm and normal heart sounds.   Pulmonary/Chest: Effort normal and breath sounds normal. No respiratory distress. She has no wheezes. She has no rales.  Musculoskeletal: Normal range of motion. She exhibits no edema.  Lymphadenopathy:    She has no cervical adenopathy.  Neurological: She is alert and oriented to person, place, and time. She exhibits normal muscle tone.  Skin:  Skin is warm and dry. No rash noted. She is not diaphoretic.  Psychiatric: She has a normal mood and affect.  Nursing note and vitals reviewed.   ED Course  Procedures (including critical care time)  Labs Review Labs Reviewed  POCT RAPID STREP A   Results for orders placed or performed during the hospital encounter of 05/09/15  POCT rapid strep A [facility])  Result Value Ref Range   Streptococcus, Group A Screen  (Direct) NEGATIVE NEGATIVE     Imaging Review No results found.   Visual Acuity Review  Right Eye Distance:   Left Eye Distance:   Bilateral Distance:    Right Eye Near:   Left Eye Near:    Bilateral Near:         MDM   1. URI (upper respiratory infection)   2. Barotitis media, initial encounter   3. ETD (eustachian tube dysfunction), bilateral   4. PND (post-nasal drip)    For nasal and head congestion may take Sudafed PE 10 mg every 4 hours as needed. Saline nasal spray used frequently. For drainage may use Allegra, Claritin or Zyrtec. If you need stronger medicine to stop drainage may take Chlor-Trimeton 2-4 mg every 4 hours. This may cause drowsiness. Ibuprofen 600 mg every 6 hours as needed for pain, discomfort or fever. Drink plenty of fluids and stay well-hydrated.     Janne Napoleon, NP 05/09/15 2029

## 2015-05-13 LAB — CULTURE, GROUP A STREP (THRC)

## 2015-11-13 ENCOUNTER — Ambulatory Visit (HOSPITAL_COMMUNITY)
Admission: EM | Admit: 2015-11-13 | Discharge: 2015-11-13 | Disposition: A | Discharge status: Home / Self Care | Payer: Medicaid Other | Attending: Internal Medicine | Admitting: Internal Medicine

## 2015-11-13 ENCOUNTER — Encounter (HOSPITAL_COMMUNITY): Payer: Self-pay | Admitting: Emergency Medicine

## 2015-11-13 DIAGNOSIS — J029 Acute pharyngitis, unspecified: Secondary | ICD-10-CM | POA: Insufficient documentation

## 2015-11-13 DIAGNOSIS — Z79899 Other long term (current) drug therapy: Secondary | ICD-10-CM | POA: Insufficient documentation

## 2015-11-13 DIAGNOSIS — Z888 Allergy status to other drugs, medicaments and biological substances status: Secondary | ICD-10-CM | POA: Diagnosis not present

## 2015-11-13 DIAGNOSIS — J069 Acute upper respiratory infection, unspecified: Secondary | ICD-10-CM

## 2015-11-13 DIAGNOSIS — H9203 Otalgia, bilateral: Secondary | ICD-10-CM

## 2015-11-13 DIAGNOSIS — G988 Other disorders of nervous system: Secondary | ICD-10-CM | POA: Insufficient documentation

## 2015-11-13 LAB — POCT RAPID STREP A: Streptococcus, Group A Screen (Direct): NEGATIVE

## 2015-11-13 MED ORDER — AMOXICILLIN 500 MG PO CAPS: 500.0000 mg | ORAL_CAPSULE | Freq: Three times a day (TID) | ORAL | 0 refills | Status: DC

## 2015-11-13 NOTE — ED Provider Notes (Signed)
CSN: [id number]     Arrival date & time 11/13/15  1343 History   First MD Initiated Contact with Patient 11/13/15 1414     Chief Complaint  Patient presents with  . Sore Throat  . Otalgia   (Consider location/radiation/quality/duration/timing/severity/associated sxs/prior Treatment) HPI Patient is a 35 year old female with onset of sore throat, earache about 3 days ago. She has been using over-the-counter medications without complete relief of her symptoms. She states that she has also been running a fever she continues to have discomfort in the ears and throat at this time. State that her fever broke this morning about 4:30. 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 Past Medical History:  Diagnosis Date  . Nerve disorder   . Trigeminal nerve disease    History reviewed. No pertinent surgical history. History reviewed. No pertinent family history. Social History  Substance Use Topics  . Smoking status: Never Smoker  . Smokeless tobacco: Not on file  . Alcohol use Yes     Comment: occ   OB History    No data available     Review of Systems  Denies: HEADACHE, NAUSEA, ABDOMINAL PAIN, CHEST PAIN, CONGESTION, DYSURIA, SHORTNESS OF BREATH  Allergies  Carbamazepine and Dilantin [phenytoin]  Home Medications   Prior to Admission medications   Medication Sig Start Date End Date Taking? Authorizing Provider  amoxicillin (AMOXIL) 500 MG capsule Take 1 capsule (500 mg total) by mouth 3 (three) times daily. 11/13/15   Konrad Felix, PA  azithromycin (ZITHROMAX Z-PAK) 250 MG tablet Take as directed on pack 02/07/15   Billy Fischer, MD  ipratropium (ATROVENT) 0.06 % nasal spray Place 2 sprays into both nostrils 4 (four) times daily. 02/07/15   Billy Fischer, MD  levETIRAcetam (KEPPRA XR) 500 MG 24 hr tablet Take 500-1,000 mg by mouth 2 (two) times daily. 500mg  in the morning and 1000mg  in the evening.    Historical Provider, MD   meclizine (ANTIVERT) 25 MG tablet Take 1 tablet (25 mg total) by mouth 3 (three) times daily as needed. Patient not taking: Reported on 03/30/2014 02/08/14   Carman Ching, PA-C  Multiple Vitamin (MULTIVITAMIN WITH MINERALS) TABS tablet Take 1 tablet by mouth daily.    Historical Provider, MD   Meds Ordered and Administered this Visit  Medications - No data to display  BP 134/82 (BP Location: Right Arm)   Pulse 97   Temp 98.3 F (36.8 C) (Oral)   Resp 18   LMP 11/01/2015   SpO2 99%  No data found.   Physical Exam NURSES NOTES AND VITAL SIGNS REVIEWED. CONSTITUTIONAL: Well developed, well nourished, no acute distress HEENT: normocephalic, atraumatic THROAT IS INJECTED, WITHOUT EXUDATE, TM'S ARE CLEAR B/L EYES: Conjunctiva normal NECK:normal ROM, supple, no adenopathy PULMONARY:No respiratory distress, normal effort ABDOMINAL: Soft, ND, NT BS+, No CVAT MUSCULOSKELETAL: Normal ROM of all extremities,  SKIN: warm and dry without rash PSYCHIATRIC: Mood and affect, behavior are normal  Urgent Care Course   Clinical Course    Procedures (including critical care time)  Labs Review Labs Reviewed  POCT RAPID STREP A    Imaging Review No results found.   Visual Acuity Review  Right Eye Distance:   Left Eye Distance:   Bilateral Distance:    Right Eye Near:   Left Eye Near:    Bilateral Near:        RX FOR AMOXIL PROVIDED.  MDM  1. URI (upper respiratory infection)   2. Sore throat   3. Otalgia, bilateral     Patient is reassured that there are no issues that require transfer to higher level of care at this time or additional tests. Patient is advised to continue home symptomatic treatment. Patient is advised that if there are new or worsening symptoms to attend the emergency department, contact primary care provider, or return to UC. Instructions of care provided discharged home in stable condition.    THIS NOTE WAS GENERATED USING A VOICE RECOGNITION  SOFTWARE PROGRAM. ALL REASONABLE EFFORTS  WERE MADE TO PROOFREAD THIS DOCUMENT FOR ACCURACY.  I have verbally reviewed the discharge instructions with the patient. A printed AVS was given to the patient.  All questions were answered prior to discharge.      Konrad Felix, [place] 11/13/15 [phone]

## 2015-11-13 NOTE — ED Triage Notes (Signed)
Pt has been suffering from bilateral ear pain and throat pain for three days.  Pt reports a fever of 101 at home, but is afebrile today after Advil at 0400 this morning.

## 2015-11-15 LAB — CULTURE, GROUP A STREP (THRC)

## 2015-12-05 ENCOUNTER — Inpatient Hospital Stay (HOSPITAL_COMMUNITY)
Admission: EM | Admit: 2015-12-05 | Discharge: 2015-12-08 | DRG: 872 | Disposition: A | Discharge status: Home / Self Care | Payer: Medicaid Other | Attending: Internal Medicine | Admitting: Internal Medicine

## 2015-12-05 ENCOUNTER — Emergency Department (HOSPITAL_COMMUNITY): Payer: Medicaid Other

## 2015-12-05 ENCOUNTER — Encounter (HOSPITAL_COMMUNITY): Payer: Self-pay | Admitting: Emergency Medicine

## 2015-12-05 ENCOUNTER — Ambulatory Visit (HOSPITAL_COMMUNITY)
Admission: EM | Admit: 2015-12-05 | Discharge: 2015-12-05 | Disposition: A | Discharge status: Nursing Home (Includes ICF) | Payer: Medicaid Other | Attending: Internal Medicine | Admitting: Internal Medicine

## 2015-12-05 ENCOUNTER — Encounter (HOSPITAL_COMMUNITY): Payer: Self-pay | Admitting: *Deleted

## 2015-12-05 DIAGNOSIS — K219 Gastro-esophageal reflux disease without esophagitis: Secondary | ICD-10-CM | POA: Diagnosis present

## 2015-12-05 DIAGNOSIS — A419 Sepsis, unspecified organism: Principal | ICD-10-CM | POA: Diagnosis present

## 2015-12-05 DIAGNOSIS — K769 Liver disease, unspecified: Secondary | ICD-10-CM

## 2015-12-05 DIAGNOSIS — R1084 Generalized abdominal pain: Secondary | ICD-10-CM

## 2015-12-05 DIAGNOSIS — T8389XA Other specified complication of genitourinary prosthetic devices, implants and grafts, initial encounter: Secondary | ICD-10-CM | POA: Diagnosis present

## 2015-12-05 DIAGNOSIS — D1803 Hemangioma of intra-abdominal structures: Secondary | ICD-10-CM | POA: Diagnosis present

## 2015-12-05 DIAGNOSIS — E860 Dehydration: Secondary | ICD-10-CM | POA: Diagnosis present

## 2015-12-05 DIAGNOSIS — R600 Localized edema: Secondary | ICD-10-CM | POA: Diagnosis present

## 2015-12-05 DIAGNOSIS — R109 Unspecified abdominal pain: Secondary | ICD-10-CM

## 2015-12-05 DIAGNOSIS — N760 Acute vaginitis: Secondary | ICD-10-CM | POA: Diagnosis present

## 2015-12-05 DIAGNOSIS — A549 Gonococcal infection, unspecified: Secondary | ICD-10-CM

## 2015-12-05 DIAGNOSIS — E876 Hypokalemia: Secondary | ICD-10-CM | POA: Diagnosis present

## 2015-12-05 DIAGNOSIS — G500 Trigeminal neuralgia: Secondary | ICD-10-CM | POA: Diagnosis present

## 2015-12-05 DIAGNOSIS — N730 Acute parametritis and pelvic cellulitis: Secondary | ICD-10-CM | POA: Diagnosis present

## 2015-12-05 DIAGNOSIS — A599 Trichomoniasis, unspecified: Secondary | ICD-10-CM | POA: Diagnosis present

## 2015-12-05 DIAGNOSIS — Z888 Allergy status to other drugs, medicaments and biological substances status: Secondary | ICD-10-CM

## 2015-12-05 DIAGNOSIS — Z6841 Body Mass Index (BMI) 40.0 and over, adult: Secondary | ICD-10-CM

## 2015-12-05 DIAGNOSIS — B9689 Other specified bacterial agents as the cause of diseases classified elsewhere: Secondary | ICD-10-CM | POA: Diagnosis present

## 2015-12-05 LAB — I-STAT CG4 LACTIC ACID, ED: Lactic Acid, Venous: 1.6 mmol/L (ref 0.5–1.9)

## 2015-12-05 LAB — POCT URINALYSIS DIP (DEVICE)
Bilirubin Urine: NEGATIVE
GLUCOSE, UA: NEGATIVE mg/dL
Hgb urine dipstick: NEGATIVE
KETONES UR: NEGATIVE mg/dL
Leukocytes, UA: NEGATIVE
NITRITE: NEGATIVE
PH: 8.5 — AB (ref 5.0–8.0)
PROTEIN: 30 mg/dL — AB
Specific Gravity, Urine: 1.02 (ref 1.005–1.030)
Urobilinogen, UA: 1 mg/dL (ref 0.0–1.0)

## 2015-12-05 LAB — CBC
HEMATOCRIT: 36.4 % (ref 36.0–46.0)
HEMOGLOBIN: 11.4 g/dL — AB (ref 12.0–15.0)
MCH: 26.7 pg (ref 26.0–34.0)
MCHC: 31.3 g/dL (ref 30.0–36.0)
MCV: 85.2 fL (ref 78.0–100.0)
Platelets: 298 10*3/uL (ref 150–400)
RBC: 4.27 MIL/uL (ref 3.87–5.11)
RDW: 13.5 % (ref 11.5–15.5)
WBC: 11.8 10*3/uL — AB (ref 4.0–10.5)

## 2015-12-05 LAB — COMPREHENSIVE METABOLIC PANEL
ALT: 21 U/L (ref 14–54)
ANION GAP: 12 (ref 5–15)
AST: 20 U/L (ref 15–41)
Albumin: 3.3 g/dL — ABNORMAL LOW (ref 3.5–5.0)
Alkaline Phosphatase: 83 U/L (ref 38–126)
BILIRUBIN TOTAL: 1 mg/dL (ref 0.3–1.2)
BUN: 7 mg/dL (ref 6–20)
CHLORIDE: 105 mmol/L (ref 101–111)
CO2: 21 mmol/L — ABNORMAL LOW (ref 22–32)
Calcium: 8.9 mg/dL (ref 8.9–10.3)
Creatinine, Ser: 0.91 mg/dL (ref 0.44–1.00)
Glucose, Bld: 93 mg/dL (ref 65–99)
POTASSIUM: 3.9 mmol/L (ref 3.5–5.1)
Sodium: 138 mmol/L (ref 135–145)
TOTAL PROTEIN: 6.4 g/dL — AB (ref 6.5–8.1)

## 2015-12-05 LAB — URINALYSIS, ROUTINE W REFLEX MICROSCOPIC
Bilirubin Urine: NEGATIVE
GLUCOSE, UA: NEGATIVE mg/dL
Hgb urine dipstick: NEGATIVE
LEUKOCYTES UA: NEGATIVE
NITRITE: NEGATIVE
PH: 6.5 (ref 5.0–8.0)
Protein, ur: NEGATIVE mg/dL
Specific Gravity, Urine: 1.035 — ABNORMAL HIGH (ref 1.005–1.030)

## 2015-12-05 LAB — POCT PREGNANCY, URINE: PREG TEST UR: NEGATIVE

## 2015-12-05 LAB — LIPASE, BLOOD: LIPASE: 18 U/L (ref 11–51)

## 2015-12-05 MED ORDER — ACETAMINOPHEN 325 MG PO TABS
650.0000 mg | ORAL_TABLET | Freq: Once | ORAL | Status: AC
  Administered 2015-12-05: 650 mg via ORAL

## 2015-12-05 MED ORDER — AZITHROMYCIN 250 MG PO TABS
ORAL_TABLET | ORAL | Status: AC
  Filled 2015-12-05: qty 4

## 2015-12-05 MED ORDER — SODIUM CHLORIDE 0.9 % IV BOLUS (SEPSIS)
1000.0000 mL | Freq: Once | INTRAVENOUS | Status: AC
Start: 2015-12-05 — End: 2015-12-05
  Administered 2015-12-05: 1000 mL via INTRAVENOUS

## 2015-12-05 MED ORDER — DEXTROSE 5 % IV SOLN
500.0000 mg | Freq: Once | INTRAVENOUS | Status: AC
  Administered 2015-12-06: 500 mg via INTRAVENOUS
  Filled 2015-12-05: qty 500

## 2015-12-05 MED ORDER — ACETAMINOPHEN 325 MG PO TABS
ORAL_TABLET | ORAL | Status: AC
  Filled 2015-12-05: qty 2

## 2015-12-05 MED ORDER — IOPAMIDOL (ISOVUE-300) INJECTION 61%
INTRAVENOUS | Status: AC
  Administered 2015-12-05: 100 mL
  Filled 2015-12-05: qty 100

## 2015-12-05 MED ORDER — LIDOCAINE HCL (PF) 1 % IJ SOLN
INTRAMUSCULAR | Status: AC
  Filled 2015-12-05: qty 2

## 2015-12-05 MED ORDER — DEXTROSE 5 % IV SOLN
1.0000 g | Freq: Once | INTRAVENOUS | Status: AC
  Administered 2015-12-06: 1 g via INTRAVENOUS
  Filled 2015-12-05: qty 10

## 2015-12-05 MED ORDER — MORPHINE SULFATE (PF) 2 MG/ML IV SOLN
2.0000 mg | Freq: Once | INTRAVENOUS | Status: AC
  Administered 2015-12-05: 2 mg via INTRAVENOUS
  Filled 2015-12-05: qty 1

## 2015-12-05 NOTE — ED Provider Notes (Signed)
CSN: [id number]     Arrival date & time 12/05/15  1344 History   None    Chief Complaint  Patient presents with  . Abdominal Pain  . Fever  . Leg Swelling    feet   (Consider location/radiation/quality/duration/timing/severity/associated sxs/prior Treatment)  HPI  Patient is a 36 year old female presenting today with complaints of abdominal pain that started yesterday evening. Patient states the pain initially began in the pelvic region and has gradually ascended and she feels as though her belly is swelling. Patient denies nausea, vomiting, diarrhea. Patient denies any issues with vaginal discharge or infection. Denies urinary tract symptoms. She is currently on her menstrual cycle which started this past Tuesday, to December 5. She has an IUD in place which was placed in 2013.   Past Medical History:  Diagnosis Date  . Nerve disorder   . Trigeminal nerve disease    History reviewed. No pertinent surgical history. No family history on file. Social History  Substance Use Topics  . Smoking status: Never Smoker  . Smokeless tobacco: Never Used  . Alcohol use Yes     Comment: occ   OB History    No data available     Review of Systems  Constitutional: Positive for appetite change and fever. Negative for fatigue.  HENT: Negative.   Eyes: Negative.  Negative for visual disturbance.  Respiratory: Negative.  Negative for cough and shortness of breath.   Cardiovascular: Positive for leg swelling. Negative for chest pain.  Gastrointestinal: Positive for abdominal distention and abdominal pain. Negative for anal bleeding, blood in stool, constipation, diarrhea, nausea, rectal pain and vomiting.  Endocrine: Negative.   Genitourinary: Positive for vaginal bleeding. Negative for decreased urine volume, difficulty urinating, dyspareunia, dysuria, enuresis, flank pain, frequency, genital sores, hematuria, menstrual problem, pelvic pain, urgency, vaginal discharge and vaginal pain.   Currently on her menses.   Musculoskeletal: Negative.  Negative for gait problem and neck stiffness.  Skin: Negative.  Negative for rash.  Allergic/Immunologic: Negative.   Neurological: Negative.  Negative for dizziness and headaches.  Hematological: Negative.   Psychiatric/Behavioral: Negative.     Allergies  Carbamazepine and Dilantin [phenytoin]  Home Medications   Prior to Admission medications   Medication Sig Start Date End Date Taking? Authorizing Provider  amoxicillin (AMOXIL) 500 MG capsule Take 1 capsule (500 mg total) by mouth 3 (three) times daily. 11/13/15   Konrad Felix, PA  azithromycin (ZITHROMAX Z-PAK) 250 MG tablet Take as directed on pack 02/07/15   Billy Fischer, MD  ipratropium (ATROVENT) 0.06 % nasal spray Place 2 sprays into both nostrils 4 (four) times daily. 02/07/15   Billy Fischer, MD  levETIRAcetam (KEPPRA XR) 500 MG 24 hr tablet Take 500-1,000 mg by mouth 2 (two) times daily. 500mg  in the morning and 1000mg  in the evening.    Historical Provider, MD  meclizine (ANTIVERT) 25 MG tablet Take 1 tablet (25 mg total) by mouth 3 (three) times daily as needed. Patient not taking: Reported on 03/30/2014 02/08/14   Carman Ching, PA-C  Multiple Vitamin (MULTIVITAMIN WITH MINERALS) TABS tablet Take 1 tablet by mouth daily.    Historical Provider, MD   Meds Ordered and Administered this Visit   Medications  acetaminophen (TYLENOL) tablet 650 mg (650 mg Oral Given 12/05/15 1548)    Pulse 104   Temp 101.7 F (38.7 C) (Oral)   Resp 16   LMP 12/01/2015   SpO2 100%  No data found.   Physical Exam  Constitutional: She appears well-developed and well-nourished. No distress.  Eyes: Conjunctivae are normal. Pupils are equal, round, and reactive to light. Right eye exhibits no discharge. Left eye exhibits no discharge. No scleral icterus.  Neck: Normal range of motion. Neck supple. No thyromegaly present.  Cardiovascular: Regular rhythm, normal heart sounds and intact  distal pulses.  Exam reveals no gallop and no friction rub.   No murmur heard. Patient is tachycardic.  Pulmonary/Chest: Effort normal and breath sounds normal. No respiratory distress. She has no wheezes. She has no rales. She exhibits no tenderness.  Abdominal: Soft. Bowel sounds are normal. She exhibits distension. She exhibits no mass. There is tenderness. There is no rebound and no guarding. No hernia.  Examination limited by body habitus.  Patient's abdomen rounded.  She reports it is distended.  Patient is tender with light to moderate palpation in all quadrants of abdomen. Positive Murphy's and McBurney's. He experiences pain with movement and lying down. Positive peritoneal signs.  Skin: Skin is warm and dry. Capillary refill takes 2 to 3 seconds. No rash noted. She is not diaphoretic.  Nursing note and vitals reviewed.   Urgent Care Course   Clinical Course    Procedures (including critical care time)  Labs Review Labs Reviewed  POCT URINALYSIS DIP (DEVICE) - Abnormal; Notable for the following:       Result Value   pH 8.5 (*)    Protein, ur 30 (*)    All other components within normal limits  POCT PREGNANCY, URINE   Results for orders placed or performed during the hospital encounter of 12/05/15  POCT urinalysis dip (device)  Result Value Ref Range   Glucose, UA NEGATIVE NEGATIVE mg/dL   Bilirubin Urine NEGATIVE NEGATIVE   Ketones, ur NEGATIVE NEGATIVE mg/dL   Specific Gravity, Urine 1.020 1.005 - 1.030   Hgb urine dipstick NEGATIVE NEGATIVE   pH 8.5 (H) 5.0 - 8.0   Protein, ur 30 (A) NEGATIVE mg/dL   Urobilinogen, UA 1.0 0.0 - 1.0 mg/dL   Nitrite NEGATIVE NEGATIVE   Leukocytes, UA NEGATIVE NEGATIVE  Pregnancy, urine POC  Result Value Ref Range   Preg Test, Ur NEGATIVE NEGATIVE    Imaging Review No results found.  Plan of care was discussed with Dr. Valere Dross.    MDM   1. Generalized abdominal pain    Patient transferred to [facility] ED via shuttle for further  evaluation.     Nehemiah Settle, NP 12/05/15 2133

## 2015-12-05 NOTE — ED Provider Notes (Signed)
[facility] DEPT Provider Note   CSN: [id number] Arrival date & time: 12/05/15  1713     History   Chief Complaint Chief Complaint  Patient presents with  . Abdominal Pain    HPI Christine Alvarado is a 35 y.o. female.  HPI 35 year old female resents today complaining of abdominal pain that began yesterday evening. She states it is initially entered the umbilical area but then began to be diffuse in nature. Pain has become severe today. She has been running a fever today. She has had decreased by mouth intake but has not vomited or had diarrhea. States that she has an IUD in place and has been having normal menstrual cycles with her last menses beginning on Tuesday. Denies any increased frequency of urination, pain with urination, or discoloration of urine. Denies any similar symptoms in the past Past Medical History:  Diagnosis Date  . Nerve disorder   . Trigeminal nerve disease     There are no active problems to display for this patient.   History reviewed. No pertinent surgical history.  OB History    No data available       Home Medications    Prior to Admission medications   Medication Sig Start Date End Date Taking? Authorizing Provider  ibuprofen (ADVIL,MOTRIN) 200 MG tablet Take 600 mg by mouth every 6 (six) hours as needed for moderate pain.   Yes Historical Provider, MD  amoxicillin (AMOXIL) 500 MG capsule Take 1 capsule (500 mg total) by mouth 3 (three) times daily. Patient not taking: Reported on 12/05/2015 11/13/15   Konrad Felix, PA  azithromycin (ZITHROMAX Z-PAK) 250 MG tablet Take as directed on pack Patient not taking: Reported on 12/05/2015 02/07/15   Billy Fischer, MD  ipratropium (ATROVENT) 0.06 % nasal spray Place 2 sprays into both nostrils 4 (four) times daily. Patient not taking: Reported on 12/05/2015 02/07/15   Billy Fischer, MD  meclizine (ANTIVERT) 25 MG tablet Take 1 tablet (25 mg total) by mouth 3 (three) times daily as needed. Patient not  taking: Reported on 03/30/2014 02/08/14   Carman Ching, PA-C    Family History History reviewed. No pertinent family history.  Social History Social History  Substance Use Topics  . Smoking status: Never Smoker  . Smokeless tobacco: Never Used  . Alcohol use Yes     Comment: occ     Allergies   Carbamazepine and Dilantin [phenytoin]   Review of Systems Review of Systems  All other systems reviewed and are negative.    Physical Exam Updated Vital Signs BP 122/71 (BP Location: Right Arm)   Pulse 107   Temp 100.5 F (38.1 C) (Oral)   Resp 20   Ht 5' 4.5" (1.638 m)   Wt 131.5 kg   LMP 12/01/2015   SpO2 98%   BMI 49.01 kg/m   Physical Exam  Constitutional: She is oriented to person, place, and time. She appears well-developed and well-nourished. She appears distressed.  HENT:  Head: Normocephalic and atraumatic.  Right Ear: External ear normal.  Left Ear: External ear normal.  Nose: Nose normal.  Eyes: Conjunctivae and EOM are normal. Pupils are equal, round, and reactive to light.  Neck: Normal range of motion. Neck supple.  Cardiovascular: Normal rate and regular rhythm.   Pulmonary/Chest: Effort normal and breath sounds normal.  Abdominal: Soft. She exhibits distension. She exhibits no mass. There is tenderness. There is no guarding.  Genitourinary:  Genitourinary Comments: No discharge in vaginal vault,  but some blood c.w. Menses Pain with bimanual ttp diffusely on pelvic exam  Musculoskeletal: Normal range of motion.  Neurological: She is alert and oriented to person, place, and time. She exhibits normal muscle tone. Coordination normal.  Skin: Skin is warm and dry.  Psychiatric: She has a normal mood and affect. Her behavior is normal. Thought content normal.  Nursing note and vitals reviewed.    ED Treatments / Results  Labs (all labs ordered are listed, but only abnormal results are displayed) Labs Reviewed  COMPREHENSIVE METABOLIC PANEL - Abnormal;  Notable for the following:       Result Value   CO2 21 (*)    Total Protein 6.4 (*)    Albumin 3.3 (*)    All other components within normal limits  CBC - Abnormal; Notable for the following:    WBC 11.8 (*)    Hemoglobin 11.4 (*)    All other components within normal limits  URINALYSIS, ROUTINE W REFLEX MICROSCOPIC (NOT AT [facility]) - Abnormal; Notable for the following:    Color, Urine AMBER (*)    Specific Gravity, Urine 1.035 (*)    Ketones, ur >80 (*)    All other components within normal limits  LIPASE, BLOOD  I-STAT CG4 LACTIC ACID, ED    EKG  EKG Interpretation None       Radiology Ct Abdomen Pelvis W Contrast  Result Date: 12/05/2015 CLINICAL DATA:  35 year old female with diffuse abdominal pain EXAM: CT ABDOMEN AND PELVIS WITH CONTRAST TECHNIQUE: Multidetector CT imaging of the abdomen and pelvis was performed using the standard protocol following bolus administration of intravenous contrast. CONTRAST:  127mL ISOVUE-300 IOPAMIDOL (ISOVUE-300) INJECTION 61% COMPARISON:  None. FINDINGS: There is mild eventration of the left hemidiaphragm with associated minimal subsegmental atelectasis of the left lung base. The visualized lung bases are otherwise clear. No intra-abdominal free air or free fluid. Indeterminate 15 mm hypodense lesion at the dome of the liver (series 2, image 13), possibly hemangioma. Other etiologies are not excluded. Further evaluation with MRI without and with contrast is recommended. The gallbladder, pancreas, spleen, adrenal glands, kidneys, visualized ureters, and urinary bladder appear unremarkable. Find the uterus is anteverted. An intrauterine device noted which appears displaced and located in the lower uterine and upper cervical region. MRI may provide better evaluation a 1.7 cm hypodense lesion in the left ovary, likely a dominant follicle/ cyst. Caps there is no evidence of bowel obstruction or active inflammation. Normal appendix. The abdominal aorta and  IVC appear unremarkable. No portal venous gas identified. There is no adenopathy there is a small fat containing umbilical hernia. The abdominal wall soft tissues are otherwise unremarkable. The osseous structures are intact. IMPRESSION: No acute intra-abdominal pelvic pathology. IUD device appears displaced and located in the lower uterus. Ultrasound may provide better evaluation pelvic structure and on CT positioned. Clinical correlation is recommended. **An incidental finding of potential clinical significance has been found. A 15 mm hypodense lesion at the dome of the liver, possibly hemangioma. MRI without and with contrast is recommended for further characterization.** Electronically Signed   By: Anner Crete M.D.   On: 12/05/2015 22:00    Procedures Procedures (including critical care time)  Medications Ordered in ED Medications  iopamidol (ISOVUE-300) 61 % injection (100 mLs  Contrast Given 12/05/15 2120)  sodium chloride 0.9 % bolus 1,000 mL (1,000 mLs Intravenous New Bag/Given 12/05/15 2206)  morphine 2 MG/ML injection 2 mg (2 mg Intravenous Given 12/05/15 2202)  Initial Impression / Assessment and Plan / ED Course  I have reviewed the triage vital signs and the nursing notes.  Pertinent labs & imaging results that were available during my care of the patient were reviewed by me and considered in my medical decision making (see chart for details).  Clinical Course    1-Abdominal pain without clear etiology, however patient continues to have severe pain and is now somewhat tachycardic despite IV fluids and pain medicines.  General surgery will see and likely admission to medicine service. Pain diffuse and may have originated in pelvis- rocephin and zithromax given. Dr. Kae Heller saw and evaluated for abdominal pain and will follow.  2 liver lesion will be follow-up as outpatient 3 migration of IUD and ovarian cyst will require outpatient follow-up  Final Clinical Impressions(s) / ED  Diagnoses   Final diagnoses:  Generalized abdominal pain  fever   New Prescriptions New Prescriptions   No medications on file     Pattricia Boss, MD 12/06/15 [phone]

## 2015-12-05 NOTE — Consult Note (Signed)
Surgical Consultation Requesting physician: Pattricia Boss, MD (ER)  CC: abdominal pain  HPI: 35yo woman presents with abdominal pain that began Thursday around the umbilicus. Denies associated nausea, emesis, constipation or diarrhea. Decreased PO intake. Ate a cookie for breakfast today. Febrile today. It has become more diffuse and she describes the pain as sharp. Has had IUD since 2013 without issue. No dysuria, urgency or frequency. No prior similar experiences. No prior abdominal surgery.    Allergies  Allergen Reactions  . Carbamazepine Nausea Only, Rash and Other (See Comments)    Abnormal LFT's also  . Dilantin [Phenytoin] Other (See Comments)    Abnormal visual changes    Past Medical History:  Diagnosis Date  . Nerve disorder   . Trigeminal nerve disease     History reviewed. No pertinent surgical history.  History reviewed. No pertinent family history.  Social History   Social History  . Marital status: Single    Spouse name: N/A  . Number of children: N/A  . Years of education: N/A   Social History Main Topics  . Smoking status: Never Smoker  . Smokeless tobacco: Never Used  . Alcohol use Yes     Comment: occ  . Drug use: No  . Sexual activity: Not Asked   Other Topics Concern  . None   Social History Narrative  . None    No current facility-administered medications on file prior to encounter.    Current Outpatient Prescriptions on File Prior to Encounter  Medication Sig Dispense Refill  . amoxicillin (AMOXIL) 500 MG capsule Take 1 capsule (500 mg total) by mouth 3 (three) times daily. (Patient not taking: Reported on 12/05/2015) 21 capsule 0  . azithromycin (ZITHROMAX Z-PAK) 250 MG tablet Take as directed on pack (Patient not taking: Reported on 12/05/2015) 6 tablet 0  . ipratropium (ATROVENT) 0.06 % nasal spray Place 2 sprays into both nostrils 4 (four) times daily. (Patient not taking: Reported on 12/05/2015) 15 mL 12  . meclizine (ANTIVERT) 25 MG  tablet Take 1 tablet (25 mg total) by mouth 3 (three) times daily as needed. (Patient not taking: Reported on 03/30/2014) 15 tablet 0    Review of Systems: a complete, 10pt review of systems was completed with pertinent positives and negatives as documented in the HPI.   Physical Exam: Vitals:   12/05/15 2030 12/05/15 2229  BP: 124/64 122/71  Pulse: 102 107  Resp:  20  Temp:  100.5 F (38.1 C)   Gen: A&Ox3, no distress, lying on the stretcher H&N: normocephalic, atraumatic, EOMI. Neck supple without mass or thyromegaly Chest: unlabored respirations, ctab, mildly tachy with palpable distal pulses Abdomen: Obese, soft, nondistended. Diffusely tender moreso around umblicus. No guarding or rebound but discomfort when bed is shaken. No hernia or mass palpable.  Extremities: warm, mild BLE edema, no deformities. Neuro: grossly intact Psych: appropriate mood and affect   Imaging: Study Result   CLINICAL DATA:  35 year old female with diffuse abdominal pain  EXAM: CT ABDOMEN AND PELVIS WITH CONTRAST  TECHNIQUE: Multidetector CT imaging of the abdomen and pelvis was performed using the standard protocol following bolus administration of intravenous contrast.  CONTRAST:  127mL ISOVUE-300 IOPAMIDOL (ISOVUE-300) INJECTION 61%  COMPARISON:  None.  FINDINGS: There is mild eventration of the left hemidiaphragm with associated minimal subsegmental atelectasis of the left lung base. The visualized lung bases are otherwise clear. No intra-abdominal free air or free fluid.  Indeterminate 15 mm hypodense lesion at the dome of the liver (series  2, image 13), possibly hemangioma. Other etiologies are not excluded. Further evaluation with MRI without and with contrast is recommended. The gallbladder, pancreas, spleen, adrenal glands, kidneys, visualized ureters, and urinary bladder appear unremarkable. Find the uterus is anteverted. An intrauterine device noted which appears displaced  and located in the lower uterine and upper cervical region. MRI may provide better evaluation a 1.7 cm hypodense lesion in the left ovary, likely a dominant follicle/ cyst.  Caps there is no evidence of bowel obstruction or active inflammation. Normal appendix.  The abdominal aorta and IVC appear unremarkable. No portal venous gas identified. There is no adenopathy there is a small fat containing umbilical hernia. The abdominal wall soft tissues are otherwise unremarkable. The osseous structures are intact.  IMPRESSION: No acute intra-abdominal pelvic pathology.  IUD device appears displaced and located in the lower uterus. Ultrasound may provide better evaluation pelvic structure and on CT positioned. Clinical correlation is recommended.  **An incidental finding of potential clinical significance has been found. A 15 mm hypodense lesion at the dome of the liver, possibly hemangioma. MRI without and with contrast is recommended for further characterization.**   CBC Latest Ref Rng & Units 12/05/2015 02/08/2014 01/23/2014  WBC 4.0 - 10.5 K/uL 11.8(H) 5.3 -  Hemoglobin 12.0 - 15.0 g/dL 11.4(L) 14.0 15.0  Hematocrit 36.0 - 46.0 % 36.4 42.5 44.0  Platelets 150 - 400 K/uL 298 222 -   CMP Latest Ref Rng & Units 12/05/2015 02/08/2014 01/23/2014  Glucose 65 - 99 mg/dL 93 118(H) 103(H)  BUN 6 - 20 mg/dL 7 10 9   Creatinine 0.44 - 1.00 mg/dL 0.91 0.99 1.00  Sodium 135 - 145 mmol/L 138 141 141  Potassium 3.5 - 5.1 mmol/L 3.9 4.5 3.8  Chloride 101 - 111 mmol/L 105 106 106  CO2 22 - 32 mmol/L 21(L) 24 -  Calcium 8.9 - 10.3 mg/dL 8.9 8.5 -  Total Protein 6.5 - 8.1 g/dL 6.4(L) - -  Total Bilirubin 0.3 - 1.2 mg/dL 1.0 - -  Alkaline Phos 38 - 126 U/L 83 - -  AST 15 - 41 U/L 20 - -  ALT 14 - 54 U/L 21 - -   Urine preg Neg Ua: specific gravity 1.035, ketones >80   A/P: 35yo woman with abdominal pain, fever, mild leukocytosis, dehydration- etiology unclear. There is a left ovarian cyst,  a small hepatic hemangioma, and a slightly migrated IUD on the CT, but none of these explain her constellation of symptoms and there is no other abnormality to intervene upon. Agree with admission for empiric abx, serial exams, fluid resuscitation. We will continue to follow.    Romana Juniper, MD [facility], [place] Pager [phone]

## 2015-12-05 NOTE — ED Notes (Signed)
Pt to CT

## 2015-12-05 NOTE — ED Triage Notes (Signed)
Pt. Stated, I m having stomach pain with a fever today.

## 2015-12-05 NOTE — ED Notes (Signed)
Pt. Was sent down from Urgent Care for further evaluation  To r/o Appendicitis

## 2015-12-05 NOTE — ED Notes (Signed)
Report called to Cecille Rubin, ED First Nurse.

## 2015-12-05 NOTE — ED Notes (Signed)
Pt did not need I&O Cath

## 2015-12-05 NOTE — ED Triage Notes (Signed)
Pt c/o generalized abdominal pain since yesterday, denies n/v/d. Pt coming from UC, had neg preg test and UA done.

## 2015-12-06 ENCOUNTER — Encounter (HOSPITAL_COMMUNITY): Payer: Self-pay | Admitting: *Deleted

## 2015-12-06 ENCOUNTER — Inpatient Hospital Stay (HOSPITAL_COMMUNITY): Payer: Medicaid Other

## 2015-12-06 DIAGNOSIS — N760 Acute vaginitis: Secondary | ICD-10-CM | POA: Diagnosis not present

## 2015-12-06 DIAGNOSIS — G500 Trigeminal neuralgia: Secondary | ICD-10-CM | POA: Diagnosis not present

## 2015-12-06 DIAGNOSIS — A419 Sepsis, unspecified organism: Secondary | ICD-10-CM | POA: Diagnosis not present

## 2015-12-06 DIAGNOSIS — R600 Localized edema: Secondary | ICD-10-CM | POA: Diagnosis present

## 2015-12-06 DIAGNOSIS — A599 Trichomoniasis, unspecified: Secondary | ICD-10-CM | POA: Diagnosis not present

## 2015-12-06 DIAGNOSIS — E860 Dehydration: Secondary | ICD-10-CM | POA: Diagnosis not present

## 2015-12-06 DIAGNOSIS — R1084 Generalized abdominal pain: Secondary | ICD-10-CM | POA: Diagnosis not present

## 2015-12-06 DIAGNOSIS — T8389XA Other specified complication of genitourinary prosthetic devices, implants and grafts, initial encounter: Secondary | ICD-10-CM | POA: Diagnosis not present

## 2015-12-06 DIAGNOSIS — A499 Bacterial infection, unspecified: Secondary | ICD-10-CM

## 2015-12-06 DIAGNOSIS — K219 Gastro-esophageal reflux disease without esophagitis: Secondary | ICD-10-CM | POA: Diagnosis present

## 2015-12-06 DIAGNOSIS — E876 Hypokalemia: Secondary | ICD-10-CM | POA: Diagnosis present

## 2015-12-06 DIAGNOSIS — B9689 Other specified bacterial agents as the cause of diseases classified elsewhere: Secondary | ICD-10-CM | POA: Diagnosis present

## 2015-12-06 DIAGNOSIS — D1803 Hemangioma of intra-abdominal structures: Secondary | ICD-10-CM | POA: Diagnosis not present

## 2015-12-06 DIAGNOSIS — Z6841 Body Mass Index (BMI) 40.0 and over, adult: Secondary | ICD-10-CM | POA: Diagnosis not present

## 2015-12-06 DIAGNOSIS — A549 Gonococcal infection, unspecified: Secondary | ICD-10-CM | POA: Diagnosis not present

## 2015-12-06 DIAGNOSIS — Z888 Allergy status to other drugs, medicaments and biological substances status: Secondary | ICD-10-CM | POA: Diagnosis not present

## 2015-12-06 DIAGNOSIS — N730 Acute parametritis and pelvic cellulitis: Secondary | ICD-10-CM | POA: Diagnosis not present

## 2015-12-06 DIAGNOSIS — R109 Unspecified abdominal pain: Secondary | ICD-10-CM | POA: Diagnosis present

## 2015-12-06 DIAGNOSIS — R100 Acute abdomen: Secondary | ICD-10-CM

## 2015-12-06 LAB — CBC
HCT: 33.9 % — ABNORMAL LOW (ref 36.0–46.0)
HEMOGLOBIN: 10.7 g/dL — AB (ref 12.0–15.0)
MCH: 27.1 pg (ref 26.0–34.0)
MCHC: 31.6 g/dL (ref 30.0–36.0)
MCV: 85.8 fL (ref 78.0–100.0)
PLATELETS: 248 10*3/uL (ref 150–400)
RBC: 3.95 MIL/uL (ref 3.87–5.11)
RDW: 13.6 % (ref 11.5–15.5)
WBC: 12.8 10*3/uL — AB (ref 4.0–10.5)

## 2015-12-06 LAB — RAPID HIV SCREEN (HIV 1/2 AB+AG)
HIV 1/2 ANTIBODIES: NONREACTIVE
HIV-1 P24 ANTIGEN - HIV24: NONREACTIVE

## 2015-12-06 LAB — BASIC METABOLIC PANEL
Anion gap: 8 (ref 5–15)
BUN: <5 mg/dL — ABNORMAL LOW (ref 6–20)
CHLORIDE: 108 mmol/L (ref 101–111)
CO2: 21 mmol/L — AB (ref 22–32)
CREATININE: 0.79 mg/dL (ref 0.44–1.00)
Calcium: 8 mg/dL — ABNORMAL LOW (ref 8.9–10.3)
GFR calc Af Amer: >60 mL/min (ref >60)
GFR calc non Af Amer: >60 mL/min (ref >60)
Glucose, Bld: 100 mg/dL — ABNORMAL HIGH (ref 65–99)
Potassium: 3.3 mmol/L — ABNORMAL LOW (ref 3.5–5.1)
Sodium: 137 mmol/L (ref 135–145)

## 2015-12-06 LAB — WET PREP, GENITAL
SPERM: NONE SEEN
Yeast Wet Prep HPF POC: NONE SEEN

## 2015-12-06 LAB — PROTIME-INR
INR: 1.44
PROTHROMBIN TIME: 17.7 s — AB (ref 11.4–15.2)

## 2015-12-06 LAB — HIV ANTIBODY (ROUTINE TESTING W REFLEX): HIV SCREEN 4TH GENERATION: NONREACTIVE

## 2015-12-06 LAB — APTT: APTT: 37 s — AB (ref 24–36)

## 2015-12-06 LAB — LACTIC ACID, PLASMA
Lactic Acid, Venous: 2.1 mmol/L (ref 0.5–1.9)
Lactic Acid, Venous: 0.7 mmol/L (ref 0.5–1.9)

## 2015-12-06 LAB — GLUCOSE, CAPILLARY: GLUCOSE-CAPILLARY: 99 mg/dL (ref 65–99)

## 2015-12-06 LAB — BRAIN NATRIURETIC PEPTIDE: B Natriuretic Peptide: 49 pg/mL (ref 0.0–100.0)

## 2015-12-06 LAB — PROCALCITONIN: Procalcitonin: 1.91 ng/mL

## 2015-12-06 MED ORDER — IOPAMIDOL (ISOVUE-300) INJECTION 61%
INTRAVENOUS | Status: AC
  Filled 2015-12-06: qty 30

## 2015-12-06 MED ORDER — IOPAMIDOL (ISOVUE-300) INJECTION 61%
INTRAVENOUS | Status: AC
  Administered 2015-12-06: 100 mL
  Filled 2015-12-06: qty 100

## 2015-12-06 MED ORDER — SODIUM CHLORIDE 0.9 % IV SOLN
INTRAVENOUS | Status: DC
  Administered 2015-12-06 – 2015-12-08 (×4): via INTRAVENOUS

## 2015-12-06 MED ORDER — PANTOPRAZOLE SODIUM 40 MG PO TBEC
40.0000 mg | DELAYED_RELEASE_TABLET | Freq: Every day | ORAL | Status: DC
  Administered 2015-12-06 – 2015-12-08 (×3): 40 mg via ORAL
  Filled 2015-12-06 (×3): qty 1

## 2015-12-06 MED ORDER — METRONIDAZOLE IN NACL 5-0.79 MG/ML-% IV SOLN
500.0000 mg | Freq: Three times a day (TID) | INTRAVENOUS | Status: DC
  Administered 2015-12-06 – 2015-12-08 (×8): 500 mg via INTRAVENOUS
  Filled 2015-12-06 (×8): qty 100

## 2015-12-06 MED ORDER — ACETAMINOPHEN 325 MG PO TABS
650.0000 mg | ORAL_TABLET | Freq: Four times a day (QID) | ORAL | Status: DC | PRN
  Administered 2015-12-06 – 2015-12-07 (×6): 650 mg via ORAL
  Filled 2015-12-06 (×6): qty 2

## 2015-12-06 MED ORDER — ACETAMINOPHEN 650 MG RE SUPP: 650.0000 mg | Freq: Four times a day (QID) | RECTAL | Status: DC | PRN

## 2015-12-06 MED ORDER — MORPHINE SULFATE (PF) 2 MG/ML IV SOLN
2.0000 mg | INTRAVENOUS | Status: DC | PRN
  Administered 2015-12-06 (×3): 2 mg via INTRAVENOUS
  Filled 2015-12-06 (×3): qty 1

## 2015-12-06 MED ORDER — SODIUM CHLORIDE 0.9% FLUSH
3.0000 mL | Freq: Two times a day (BID) | INTRAVENOUS | Status: DC
  Administered 2015-12-06 – 2015-12-08 (×3): 3 mL via INTRAVENOUS

## 2015-12-06 MED ORDER — ONDANSETRON HCL 4 MG/2ML IJ SOLN
4.0000 mg | Freq: Three times a day (TID) | INTRAMUSCULAR | Status: DC | PRN
  Administered 2015-12-07: 4 mg via INTRAVENOUS
  Filled 2015-12-06 (×2): qty 2

## 2015-12-06 MED ORDER — FAMOTIDINE IN NACL 20-0.9 MG/50ML-% IV SOLN
20.0000 mg | Freq: Two times a day (BID) | INTRAVENOUS | Status: DC
  Administered 2015-12-06 – 2015-12-07 (×3): 20 mg via INTRAVENOUS
  Filled 2015-12-06 (×4): qty 50

## 2015-12-06 MED ORDER — SODIUM CHLORIDE 0.9 % IV BOLUS (SEPSIS)
500.0000 mL | Freq: Once | INTRAVENOUS | Status: AC
  Administered 2015-12-06: 500 mL via INTRAVENOUS

## 2015-12-06 MED ORDER — DEXTROSE 5 % IV SOLN
1.0000 g | INTRAVENOUS | Status: DC
  Administered 2015-12-07 – 2015-12-08 (×2): 1 g via INTRAVENOUS
  Filled 2015-12-06 (×2): qty 10

## 2015-12-06 MED ORDER — SODIUM CHLORIDE 0.9 % IV BOLUS (SEPSIS)
1500.0000 mL | Freq: Once | INTRAVENOUS | Status: AC
  Administered 2015-12-06: 1500 mL via INTRAVENOUS

## 2015-12-06 NOTE — Progress Notes (Signed)
Patient seen and examined. Admitted after midnight with abd pain and fever. In work up was found to have sepsis from presumed PID. Currently denies nausea and vomiting. Patient still with low grade temp and complaining of epigastric pain. Reports being very hungry. No CP and no SOB. Please refer to H&P written by Dr. Blaine Hamper for further info/details on admission.  Plan: -will continue IVF's -repeat CT scan as per surgery recommendations; if neg will advance diet -continue IV antibiotics and supportive care -rest as per H&P assessment and plan  Barton Dubois [phone]

## 2015-12-06 NOTE — Progress Notes (Signed)
CRITICAL VALUE ALERT  Critical value received: Lactic acid level 2.1  Date of notification:  12/06/15  Time of notification:  0326  Critical value read back:yes  Nurse who received alert:  Darreld Mclean, RN  MD notified (1st page):  Hospital on call pager  Time of first page:  0330  Responding MD:  Hospitalist on call Rhetta Mura Schorr, NP) entered 500 cc bolus into [facility]

## 2015-12-06 NOTE — ED Notes (Signed)
Repaged Internal Med @ 0037-TY

## 2015-12-06 NOTE — Progress Notes (Signed)
Subjective: Patient reports minimal improvement in abdominal pain. Reports it is mostly epigastric now  Objective: Vital signs in last 24 hours: Temp:  [97.9 F (36.6 C)-101.9 F (38.8 C)] 99.6 F (37.6 C) (09/10 0615) Pulse Rate:  [101-119] 101 (09/10 0615) Resp:  [16-23] 18 (09/10 0615) BP: (101-130)/(58-81) 116/58 (09/10 0615) SpO2:  [97 %-100 %] 100 % (09/10 0615) Weight:  [131.5 kg (290 lb)-131.8 kg (290 lb 8 oz)] 131.8 kg (290 lb 8 oz) (09/10 0135) Last BM Date: 12/05/15  Intake/Output from previous day: 09/09 0701 - 09/10 0700 In: 831.7 [I.V.:231.7; IV Piggyback:600] Out: -  Intake/Output this shift: No intake/output data recorded.  Abdomen obese.  Tenderness with guarding in epigastrium  Lab Results:   Recent Labs  12/05/15 1741 12/06/15 0613  WBC 11.8* 12.8*  HGB 11.4* 10.7*  HCT 36.4 33.9*  PLT 298 248   BMET  Recent Labs  12/05/15 1741 12/06/15 0613  NA 138 137  K 3.9 3.3*  CL 105 108  CO2 21* 21*  GLUCOSE 93 100*  BUN 7 <5*  CREATININE 0.91 0.79  CALCIUM 8.9 8.0*   PT/INR  Recent Labs  12/06/15 0612  LABPROT 17.7*  INR 1.44   ABG No results for input(s): PHART, HCO3 in the last 72 hours.  Invalid input(s): PCO2, PO2  Studies/Results: Ct Abdomen Pelvis W Contrast  Result Date: 12/05/2015 CLINICAL DATA:  35 year old female with diffuse abdominal pain EXAM: CT ABDOMEN AND PELVIS WITH CONTRAST TECHNIQUE: Multidetector CT imaging of the abdomen and pelvis was performed using the standard protocol following bolus administration of intravenous contrast. CONTRAST:  140mL ISOVUE-300 IOPAMIDOL (ISOVUE-300) INJECTION 61% COMPARISON:  None. FINDINGS: There is mild eventration of the left hemidiaphragm with associated minimal subsegmental atelectasis of the left lung base. The visualized lung bases are otherwise clear. No intra-abdominal free air or free fluid. Indeterminate 15 mm hypodense lesion at the dome of the liver (series 2, image 13),  possibly hemangioma. Other etiologies are not excluded. Further evaluation with MRI without and with contrast is recommended. The gallbladder, pancreas, spleen, adrenal glands, kidneys, visualized ureters, and urinary bladder appear unremarkable. Find the uterus is anteverted. An intrauterine device noted which appears displaced and located in the lower uterine and upper cervical region. MRI may provide better evaluation a 1.7 cm hypodense lesion in the left ovary, likely a dominant follicle/ cyst. Caps there is no evidence of bowel obstruction or active inflammation. Normal appendix. The abdominal aorta and IVC appear unremarkable. No portal venous gas identified. There is no adenopathy there is a small fat containing umbilical hernia. The abdominal wall soft tissues are otherwise unremarkable. The osseous structures are intact. IMPRESSION: No acute intra-abdominal pelvic pathology. IUD device appears displaced and located in the lower uterus. Ultrasound may provide better evaluation pelvic structure and on CT positioned. Clinical correlation is recommended. **An incidental finding of potential clinical significance has been found. A 15 mm hypodense lesion at the dome of the liver, possibly hemangioma. MRI without and with contrast is recommended for further characterization.** Electronically Signed   By: Anner Crete M.D.   On: 12/05/2015 22:00    Anti-infectives: Anti-infectives    Start     Dose/Rate Route Frequency Ordered Stop   12/07/15 0030  cefTRIAXone (ROCEPHIN) 1 g in dextrose 5 % 50 mL IVPB     1 g 100 mL/hr over 30 Minutes Intravenous Every 24 hours 12/06/15 0052     12/06/15 0100  metroNIDAZOLE (FLAGYL) IVPB 500 mg  500 mg 100 mL/hr over 60 Minutes Intravenous Every 8 hours 12/06/15 0054     12/06/15 0000  cefTRIAXone (ROCEPHIN) 1 g in dextrose 5 % 50 mL IVPB     1 g 100 mL/hr over 30 Minutes Intravenous  Once 12/05/15 2349 12/06/15 0103   12/06/15 0000  azithromycin (ZITHROMAX)  500 mg in dextrose 5 % 250 mL IVPB     500 mg 250 mL/hr over 60 Minutes Intravenous  Once 12/05/15 2349 12/06/15 0125      Assessment/Plan:  Abdominal pain of uncertain etiology, negative CT yesterday.  WBC up and lactic acid level briefly increased.  Discussed options.  Repeat CT scan vs diagnostic laparoscopy.  Will order repeat CT scan to see if this helps in the diagnosis.  LOS: 0 days    Chinenye Katzenberger A 12/06/2015

## 2015-12-06 NOTE — Progress Notes (Addendum)
NURSING PROGRESS NOTE  CONSWELLO PEVEY LS:[id number] Admitted to 5w17: 12/06/2015 Attending Provider: Ivor Costa, MD    Christine Alvarado is a 35 y.o. female patient admitted from ED awake, alert  & orientated  X 4,  Full Code, VSS - Blood pressure 123/62, pulse (!) 119, temperature (!) 100.9 F (38.3 C), temperature source Oral, resp. rate 18, height 5' 4.5" (1.638 m), weight 131.5 kg (290 lb), last menstrual period 12/01/2015, SpO2 99 %., O2  RA, no c/o shortness of breath, no c/o chest pain, no distress noted. Tele #1 placed and pt is currently running:sinus tachycardia.   IV site WDL:  antecubital left, condition patent and no redness with a transparent dsg that's clean dry and intact.  Allergies:   Allergies  Allergen Reactions  . Carbamazepine Nausea Only, Rash and Other (See Comments)    Abnormal LFT's also  . Dilantin [Phenytoin] Other (See Comments)    Abnormal visual changes     Past Medical History:  Diagnosis Date  . Nerve disorder   . Trigeminal nerve disease     History:  obtained from the patient.  Pt orientation to unit, room and routine. Information packet given to patient/family and safety video watched.  Admission INP armband ID verified with patient/family, and in place. SR up x 2, fall risk assessment complete with Patient and family verbalizing understanding of risks associated with falls. Pt verbalizes an understanding of how to use the call bell and to call for help before getting out of bed.  Skin, clean-dry- intact without evidence of skin tear, bruise to RLE tears, patient unsure of how she obtained it. No evidence of skin break down noted on exam.    Will cont to monitor and assist as needed.  Darreld Mclean [place], RN 12/06/2015

## 2015-12-06 NOTE — H&P (Signed)
History and Physical    Christine Alvarado [id number] DOB: Feb 28, 1981 DOA: 12/05/2015  Referring MD/NP/PA:   PCP: Velna Ochs, MD   Patient coming from:  The patient is coming from home.  At baseline, pt is independent for most of ADL.   Chief Complaint: Abdominal pain, fever, leg edema  HPI: Christine Alvarado is a 35 y.o. female with medical history significant of trigeminal nerve disease, morbid obesity, who presents with abdominal pain, fever, leg edema  Patient states that she started having abdominal pain since yesterday. Her pain is located in the periumbilical area, constant, 10 out of 10 in severity, nonradiating. It is not aggravated all limited by any known factors. She has nausea, no vomiting or diarrhea. No symptoms of UTI. She is on 5th day of menstruating, but denies vaginal discharge. Patient has a fever and chills. No chest pain, shortness breath, cough, unilateral weakness. She reports bilateral lower leg edema.  ED Course: pt was found to have WBC 11.8, lactate of 1.60, lipase 18, negative pregnancy test, negative urinalysis, electrolytes and renal function okay, Wetprep positive for trichomonas and clue cells, temperature 101.7, tachycardia, tachypnea. Pt is admitted to telemetry bed as inpatient for further eval and treatment. General surgery, Dr. Kae Heller was consulted.  # CT abdomen/pelvis showed: no acute intra-abdominal pelvic pathology, IUD device appears displaced and located in the lower uterus; an incidental finding of a 15 mm hypodense lesion at the dome of the liver, possibly hemangioma. MRI without and with contrast is recommended for further characterization.  Review of Systems:   General: has fevers, chills, no changes in body weight, has poor appetite, has fatigue HEENT: no blurry vision, hearing changes or sore throat Respiratory: no dyspnea, coughing, wheezing CV: no chest pain, no palpitations GI: has nausea, abdominal pain, no diarrhea,  vomitingconstipation GU: no dysuria, burning on urination, increased urinary frequency, hematuria  Ext: has leg edema Neuro: no unilateral weakness, numbness, or tingling, no vision change or hearing loss Skin: no rash, no skin tear. MSK: No muscle spasm, no deformity, no limitation of range of movement in spin Heme: No easy bruising.  Travel history: No recent long distant travel.  Allergy:  Allergies  Allergen Reactions  . Carbamazepine Nausea Only, Rash and Other (See Comments)    Abnormal LFT's also  . Dilantin [Phenytoin] Other (See Comments)    Abnormal visual changes    Past Medical History:  Diagnosis Date  . Nerve disorder   . Trigeminal nerve disease     Past Surgical History:  Procedure Laterality Date  . BRAIN SURGERY    . OTHER SURGICAL HISTORY     microvascular decompression of left side of brain    Social History:  reports that she has never smoked. She has never used smokeless tobacco. She reports that she drinks alcohol. She reports that she does not use drugs.  Family History:  Family History  Problem Relation Age of Onset  . Hypertension Mother   . Hyperlipidemia Mother   . Heart disease Father   . Hyperlipidemia Father   . Stroke Father   . Diabetes Mellitus I Sister      Prior to Admission medications   Medication Sig Start Date End Date Taking? Authorizing Provider  ibuprofen (ADVIL,MOTRIN) 200 MG tablet Take 600 mg by mouth every 6 (six) hours as needed for moderate pain.   Yes Historical Provider, MD  amoxicillin (AMOXIL) 500 MG capsule Take 1 capsule (500 mg total) by mouth 3 (three) times daily.  Patient not taking: Reported on 12/05/2015 11/13/15   Konrad Felix, PA  azithromycin (ZITHROMAX Z-PAK) 250 MG tablet Take as directed on pack Patient not taking: Reported on 12/05/2015 02/07/15   Billy Fischer, MD  ipratropium (ATROVENT) 0.06 % nasal spray Place 2 sprays into both nostrils 4 (four) times daily. Patient not taking: Reported on  12/05/2015 02/07/15   Billy Fischer, MD  meclizine (ANTIVERT) 25 MG tablet Take 1 tablet (25 mg total) by mouth 3 (three) times daily as needed. Patient not taking: Reported on 03/30/2014 02/08/14   Carman Ching, PA-C    Physical Exam: Vitals:   12/06/15 0015 12/06/15 0030 12/06/15 0100 12/06/15 0135  BP:  101/61 106/62 123/62  Pulse: 110 112 115 (!) 119  Resp:    18  Temp:    (!) 100.9 F (38.3 C)  TempSrc:    Oral  SpO2: 99% 99% 98% 99%  Weight:    131.8 kg (290 lb 8 oz)  Height:    5' 4.5" (1.638 m)   General: In moderate acute distress due to pain. HEENT:       Eyes: PERRL, EOMI, no scleral icterus.       ENT: No discharge from the ears and nose, no pharynx injection, no tonsillar enlargement.        Neck: No JVD, no bruit, no mass felt. Heme: No neck lymph node enlargement. Cardiac: S1/S2, RRR, No murmurs, No gallops or rubs. Respiratory: No rales, wheezing, rhonchi or rubs. GI: Soft, nondistended, has diffused tenderness, no rebound pain, no organomegaly, BS present. GU: No hematuria Ext: has trace leg edema bilaterally. 2+DP/PT pulse bilaterally. Musculoskeletal: No joint deformities, No joint redness or warmth, no limitation of ROM in spin. Skin: No rashes.  Neuro: Alert, oriented X3, cranial nerves II-XII grossly intact, moves all extremities normally.   Psych: Patient is not psychotic, no suicidal or hemocidal ideation.  Labs on Admission: I have personally reviewed following labs and imaging studies  CBC:  Recent Labs Lab 12/05/15 1741  WBC 11.8*  HGB 11.4*  HCT 36.4  MCV 85.2  PLT [id number]   Basic Metabolic Panel:  Recent Labs Lab 12/05/15 1741  NA 138  K 3.9  CL 105  CO2 21*  GLUCOSE 93  BUN 7  CREATININE 0.91  CALCIUM 8.9   GFR: Estimated Creatinine Clearance: 117.6 mL/min (by C-G formula based on SCr of 0.91 mg/dL). Liver Function Tests:  Recent Labs Lab 12/05/15 1741  AST 20  ALT 21  ALKPHOS 83  BILITOT 1.0  PROT 6.4*  ALBUMIN 3.3*     Recent Labs Lab 12/05/15 1741  LIPASE 18   No results for input(s): AMMONIA in the last 168 hours. Coagulation Profile: No results for input(s): INR, PROTIME in the last 168 hours. Cardiac Enzymes: No results for input(s): CKTOTAL, CKMB, CKMBINDEX, TROPONINI in the last 168 hours. BNP (last 3 results) No results for input(s): PROBNP in the last 8760 hours. HbA1C: No results for input(s): HGBA1C in the last 72 hours. CBG: No results for input(s): GLUCAP in the last 168 hours. Lipid Profile: No results for input(s): CHOL, HDL, LDLCALC, TRIG, CHOLHDL, LDLDIRECT in the last 72 hours. Thyroid Function Tests: No results for input(s): TSH, T4TOTAL, FREET4, T3FREE, THYROIDAB in the last 72 hours. Anemia Panel: No results for input(s): VITAMINB12, FOLATE, FERRITIN, TIBC, IRON, RETICCTPCT in the last 72 hours. Urine analysis:    Component Value Date/Time   COLORURINE AMBER (A) 12/05/2015 2201   APPEARANCEUR  CLEAR 12/05/2015 2201   LABSPEC 1.035 (H) 12/05/2015 2201   PHURINE 6.5 12/05/2015 2201   GLUCOSEU NEGATIVE 12/05/2015 2201   HGBUR NEGATIVE 12/05/2015 2201   BILIRUBINUR NEGATIVE 12/05/2015 2201   KETONESUR >80 (A) 12/05/2015 2201   PROTEINUR NEGATIVE 12/05/2015 2201   UROBILINOGEN 1.0 12/05/2015 1540   NITRITE NEGATIVE 12/05/2015 2201   LEUKOCYTESUR NEGATIVE 12/05/2015 2201   Sepsis Labs: @LABRCNTIP (procalcitonin:4,lacticidven:4) ) Recent Results (from the past 240 hour(s))  Wet prep, genital     Status: Abnormal   Collection Time: 12/05/15 11:51 PM  Result Value Ref Range Status   Yeast Wet Prep HPF POC NONE SEEN NONE SEEN Final   Trich, Wet Prep PRESENT (A) NONE SEEN Final   Clue Cells Wet Prep HPF POC PRESENT (A) NONE SEEN Final   WBC, Wet Prep HPF POC MANY (A) NONE SEEN Final   Sperm NONE SEEN  Final     Radiological Exams on Admission: Ct Abdomen Pelvis W Contrast  Result Date: 12/05/2015 CLINICAL DATA:  35 year old female with diffuse abdominal pain EXAM:  CT ABDOMEN AND PELVIS WITH CONTRAST TECHNIQUE: Multidetector CT imaging of the abdomen and pelvis was performed using the standard protocol following bolus administration of intravenous contrast. CONTRAST:  124mL ISOVUE-300 IOPAMIDOL (ISOVUE-300) INJECTION 61% COMPARISON:  None. FINDINGS: There is mild eventration of the left hemidiaphragm with associated minimal subsegmental atelectasis of the left lung base. The visualized lung bases are otherwise clear. No intra-abdominal free air or free fluid. Indeterminate 15 mm hypodense lesion at the dome of the liver (series 2, image 13), possibly hemangioma. Other etiologies are not excluded. Further evaluation with MRI without and with contrast is recommended. The gallbladder, pancreas, spleen, adrenal glands, kidneys, visualized ureters, and urinary bladder appear unremarkable. Find the uterus is anteverted. An intrauterine device noted which appears displaced and located in the lower uterine and upper cervical region. MRI may provide better evaluation a 1.7 cm hypodense lesion in the left ovary, likely a dominant follicle/ cyst. Caps there is no evidence of bowel obstruction or active inflammation. Normal appendix. The abdominal aorta and IVC appear unremarkable. No portal venous gas identified. There is no adenopathy there is a small fat containing umbilical hernia. The abdominal wall soft tissues are otherwise unremarkable. The osseous structures are intact. IMPRESSION: No acute intra-abdominal pelvic pathology. IUD device appears displaced and located in the lower uterus. Ultrasound may provide better evaluation pelvic structure and on CT positioned. Clinical correlation is recommended. **An incidental finding of potential clinical significance has been found. A 15 mm hypodense lesion at the dome of the liver, possibly hemangioma. MRI without and with contrast is recommended for further characterization.** Electronically Signed   By: Anner Crete M.D.   On:  12/05/2015 22:00     EKG: Independently reviewed.  Sinus rhythm, tachycardia, QTC 441, right axis deviation, Q-wave in lead 2/aVF   Assessment/Plan Principal Problem:   Abdominal pain Active Problems:   Sepsis (HCC)   Trichomoniasis   Bacterial vaginosis   Leg edema  Abdominal pain and sepsis: Etiology is not clear. Lipase negative. There is a possible small hepatic hemangioma, and a slightly migrated IUD on the CT. general surgeon, Dr. Windle Guard was consulted-->no neurosurgical indication. Potential differential diagnosis is PID. She may also has trichomoniasis and bacterial vaginosis. Patient meets criteria for sepsis with leukocytosis, fever, tachycardia, tachypnea. Lactate is normal. Hemodynamically stable.  -dmitted to telemetry bed as inpatient - pt received one dose of Rocephin and azithromycin in ED, will continue Rocephin -Stat  Flagyl IV -When necessary Zofran for nausea, morphine for pain -will get Procalcitonin and trend lactic acid levels per sepsis protocol. -IVF: 2.5L of NS bolus in ED, followed by 100 cc/h  -f/u Bx, Ux and GC/Chlamydia prob  Trichomoniasis and Bacterial vaginosis:  -Started Flagyl as above  Leg edema: Has trace amount of bilateral lower leg edema. Etiology is not clear. Liver function and renal function normal. -Check  BNP  Possible ovarian cyst and dislocation of IUD: 1.7 cm hypodense lesion in the left ovary, likely a dominant follicle/cyst. -out pt follow up  Possible liver hemangioma: an incidental finding of a 15 mm hypodense lesion at the dome of the liver, possibly hemangioma. -out pt follow up.   DVT ppx: SCD Code Status: Full code Family Communication: Yes, patient's sister at bed side Disposition Plan:  Anticipate discharge back to previous home environment Consults called: General surgeon, Dr. Kae Heller  Admission status: Inpatient/tele    Date of Service 12/06/2015    Ivor Costa [facility] Pager [phone]  If 7PM-7AM,  please contact night-coverage [email or web address] Password [email or web address] 12/06/2015, 2:39 AM

## 2015-12-07 DIAGNOSIS — K219 Gastro-esophageal reflux disease without esophagitis: Secondary | ICD-10-CM

## 2015-12-07 LAB — BASIC METABOLIC PANEL
ANION GAP: 11 (ref 5–15)
BUN: 7 mg/dL (ref 6–20)
CALCIUM: 8.4 mg/dL — AB (ref 8.9–10.3)
CO2: 21 mmol/L — ABNORMAL LOW (ref 22–32)
Chloride: 106 mmol/L (ref 101–111)
Creatinine, Ser: 0.86 mg/dL (ref 0.44–1.00)
GLUCOSE: 78 mg/dL (ref 65–99)
Potassium: 3.7 mmol/L (ref 3.5–5.1)
SODIUM: 138 mmol/L (ref 135–145)

## 2015-12-07 LAB — CBC
HCT: 33.3 % — ABNORMAL LOW (ref 36.0–46.0)
HEMOGLOBIN: 10.3 g/dL — AB (ref 12.0–15.0)
MCH: 26.4 pg (ref 26.0–34.0)
MCHC: 30.9 g/dL (ref 30.0–36.0)
MCV: 85.4 fL (ref 78.0–100.0)
Platelets: 254 10*3/uL (ref 150–400)
RBC: 3.9 MIL/uL (ref 3.87–5.11)
RDW: 13.8 % (ref 11.5–15.5)
WBC: 7.3 10*3/uL (ref 4.0–10.5)

## 2015-12-07 LAB — URINE CULTURE

## 2015-12-07 LAB — GC/CHLAMYDIA PROBE AMP (~~LOC~~) NOT AT ARMC
Chlamydia: NEGATIVE
NEISSERIA GONORRHEA: POSITIVE — AB

## 2015-12-07 LAB — GLUCOSE, CAPILLARY: GLUCOSE-CAPILLARY: 73 mg/dL (ref 65–99)

## 2015-12-07 MED ORDER — SIMETHICONE 80 MG PO CHEW: 80.0000 mg | CHEWABLE_TABLET | Freq: Four times a day (QID) | ORAL | Status: DC | PRN

## 2015-12-07 MED ORDER — ONDANSETRON HCL 4 MG/2ML IJ SOLN
4.0000 mg | Freq: Once | INTRAMUSCULAR | Status: AC
  Administered 2015-12-07: 4 mg via INTRAVENOUS

## 2015-12-07 MED ORDER — FAMOTIDINE 20 MG PO TABS
40.0000 mg | ORAL_TABLET | Freq: Every day | ORAL | Status: DC
  Administered 2015-12-07: 40 mg via ORAL
  Filled 2015-12-07: qty 2

## 2015-12-07 NOTE — Progress Notes (Addendum)
[facility] PROGRESS NOTE  Christine Alvarado [id number] DOB: [birth year] DOA: 12/05/2015 PCP: Velna Ochs, MD  Interim summary and HPI Patient admitted with abd pain and sepsis. Felt to be secondary to PID. Responding well to treatment and with sepsis features essentially resolved. Will advance diet, continue antibiotics and transition medications to PO. Follow general surgery rec's.  Assessment/Plan: 1-abd pain PID/sepsis: with bacterial vaginosis and trichomoniasis seen on Wet Prep -will continue current antibiotics -continue supportive care -advance diet and follow clinical response -will follow surgery rec's -follow culture data -sepsis feature resolved  2-obesity: -low calorie diet and exercise discussed with patient -Body mass index is 49.09 kg/m.  3-GERD: -will continue PPI and famotidine   4-Hypokalemia: -repleted and WNL  5-Possible ovarian cyst and dislocation of IUD: 1.7 cm hypodense lesion in the left ovary, likely a dominant follicle/cyst. -out pt follow up  6-Possible liver hemangioma: an incidental finding of a 15 mm hypodense lesion at the dome of the liver, possibly hemangioma. -out pt follow up.   Code Status: Full Code Family Communication: no family at bedside  Disposition Plan: given no red flag son CT scan will advance diet, continue PPI and continue current antibiotics.   Consultants:  General surgery   Procedures:  See below for x-ray reports  Antibiotics:  Flagyl and rocephin  11/1015  HPI/Subjective: Obese, in mild distress due to ongoing abd pain and being hungry. Denies CP, SOB and is afebrile currently.  Objective: Vitals:   12/07/15 0541 12/07/15 1357  BP: 122/72 114/68  Pulse: 84 74  Resp: 17 18  Temp: 98.1 F (36.7 C) 99.2 F (37.3 C)    Intake/Output Summary (Last 24 hours) at 12/07/15 1455 Last data filed at 12/07/15 1327  Gross per 24 hour  Intake             2950 ml  Output              850 ml  Net              2100 ml   Filed Weights   12/05/15 1735 12/05/15 2019 12/06/15 0135  Weight: 131.5 kg (290 lb) 131.5 kg (290 lb) 131.8 kg (290 lb 8 oz)    Exam:   General:  Afebrile, no nausea, no vomiting. Expressing being hungry and feeling better overall. Still with abd pain. Last BM on 12/06/15  Cardiovascular: S1 and S2, no rubs, no gallops, no murmurs  Respiratory: CTA bilaterally  Abdomen: still with mid abdomen/epigastric and lower pelvic area; no guarding, positive rebound on exam.  Musculoskeletal: no cyanosis or clubbing   Data Reviewed: Basic Metabolic Panel:  Recent Labs Lab 12/05/15 1741 12/06/15 0613 12/07/15 1218  NA 138 137 138  K 3.9 3.3* 3.7  CL 105 108 106  CO2 21* 21* 21*  GLUCOSE 93 100* 78  BUN 7 <5* 7  CREATININE 0.91 0.79 0.86  CALCIUM 8.9 8.0* 8.4*   Liver Function Tests:  Recent Labs Lab 12/05/15 1741  AST 20  ALT 21  ALKPHOS 83  BILITOT 1.0  PROT 6.4*  ALBUMIN 3.3*    Recent Labs Lab 12/05/15 1741  LIPASE 18   CBC:  Recent Labs Lab 12/05/15 1741 12/06/15 0613 12/07/15 1218  WBC 11.8* 12.8* 7.3  HGB 11.4* 10.7* 10.3*  HCT 36.4 33.9* 33.3*  MCV 85.2 85.8 85.4  PLT 298 248 254   BNP (last 3 results)  Recent Labs  12/06/15 0226  BNP 49.0    ProBNP (last  3 results) No results for input(s): PROBNP in the last 8760 hours.  CBG:  Recent Labs Lab 12/06/15 0818 12/07/15 0813  GLUCAP 99 73    Recent Results (from the past 240 hour(s))  Urine culture     Status: Abnormal   Collection Time: 12/05/15 10:01 PM  Result Value Ref Range Status   Specimen Description URINE, RANDOM  Final   Special Requests ADDED 0206 12/06/15  Final   Culture MULTIPLE SPECIES PRESENT, SUGGEST RECOLLECTION (A)  Final   Report Status 12/07/2015 FINAL  Final  Wet prep, genital     Status: Abnormal   Collection Time: 12/05/15 11:51 PM  Result Value Ref Range Status   Yeast Wet Prep HPF POC NONE SEEN NONE SEEN Final   Trich, Wet Prep  PRESENT (A) NONE SEEN Final   Clue Cells Wet Prep HPF POC PRESENT (A) NONE SEEN Final   WBC, Wet Prep HPF POC MANY (A) NONE SEEN Final   Sperm NONE SEEN  Final  Culture, blood (x 2)     Status: None (Preliminary result)   Collection Time: 12/06/15  2:25 AM  Result Value Ref Range Status   Specimen Description BLOOD RIGHT HAND  Final   Special Requests IN PEDIATRIC BOTTLE 1CC  Final   Culture NO GROWTH 1 DAY  Final   Report Status PENDING  Incomplete  Culture, blood (x 2)     Status: None (Preliminary result)   Collection Time: 12/06/15  2:30 AM  Result Value Ref Range Status   Specimen Description BLOOD RIGHT ARM  Final   Special Requests IN PEDIATRIC BOTTLE 1CC  Final   Culture NO GROWTH 1 DAY  Final   Report Status PENDING  Incomplete     Studies: Ct Abdomen Pelvis W Contrast  Result Date: 12/06/2015 CLINICAL DATA:  Worsening abdominal pain, increasing the BC pain is mostly epigastric EXAM: CT ABDOMEN AND PELVIS WITH CONTRAST TECHNIQUE: Multidetector CT imaging of the abdomen and pelvis was performed using the standard protocol following bolus administration of intravenous contrast. CONTRAST:  179mL ISOVUE-300 IOPAMIDOL (ISOVUE-300) INJECTION 61% COMPARISON:  12/04/2015 FINDINGS: Lower chest: Subsegmental atelectasis is present within the lingula and left lower lobe. No significant pleural effusion. Heart size is nonenlarged. Hepatobiliary: A 1.4 cm low-density lesion is again visualized within the right hepatic lobe. There are no other focal hepatic lesions visualized. Gallbladder within normal limits. No biliary dilatation. Pancreas: Unremarkable. No pancreatic ductal dilatation or surrounding inflammatory changes. Spleen: Normal in size without focal abnormality. Adrenals/Urinary Tract: Adrenal glands are unremarkable. Kidneys are normal, without renal calculi, focal lesion, or hydronephrosis. Bladder is unremarkable. Stomach/Bowel: Stomach is collapsed. No dilated small bowel. Appendix  is visualized and is nondilated and contains contrast within its lumen. Vascular/Lymphatic: Aorta is non aneurysmal. Scattered sub cm right lower quadrant lymph nodes. Reproductive: Low-lying IUD in the region of the cervical canal similar compared to prior study. Probable follicle left ovary. Other: Mild subcutaneous stranding over the bilateral hips. , subtle nonspecific hazy infiltration of the anterior mesentery, most notable in the periumbilical region. Musculoskeletal: No acute osseous abnormality IMPRESSION: 1. Subtle hazy infiltration of the anterior mesentery, most notable in the periumbilical region. Findings are nonspecific and can be seen secondary to edema, inflammation, infection, or neoplasm. 2. Appendix is within normal limits 3. Low-lying intrauterine device seen within the lower uterine segment and cervix 4. 1.4 cm low-density lesion within the dome of the liver ; nonemergent MRI correlation for this finding was previously recommended. Electronically Signed  By: Donavan Foil M.D.   On: 12/06/2015 19:04   Ct Abdomen Pelvis W Contrast  Result Date: 12/05/2015 CLINICAL DATA:  35 year old female with diffuse abdominal pain EXAM: CT ABDOMEN AND PELVIS WITH CONTRAST TECHNIQUE: Multidetector CT imaging of the abdomen and pelvis was performed using the standard protocol following bolus administration of intravenous contrast. CONTRAST:  174mL ISOVUE-300 IOPAMIDOL (ISOVUE-300) INJECTION 61% COMPARISON:  None. FINDINGS: There is mild eventration of the left hemidiaphragm with associated minimal subsegmental atelectasis of the left lung base. The visualized lung bases are otherwise clear. No intra-abdominal free air or free fluid. Indeterminate 15 mm hypodense lesion at the dome of the liver (series 2, image 13), possibly hemangioma. Other etiologies are not excluded. Further evaluation with MRI without and with contrast is recommended. The gallbladder, pancreas, spleen, adrenal glands, kidneys,  visualized ureters, and urinary bladder appear unremarkable. Find the uterus is anteverted. An intrauterine device noted which appears displaced and located in the lower uterine and upper cervical region. MRI may provide better evaluation a 1.7 cm hypodense lesion in the left ovary, likely a dominant follicle/ cyst. Caps there is no evidence of bowel obstruction or active inflammation. Normal appendix. The abdominal aorta and IVC appear unremarkable. No portal venous gas identified. There is no adenopathy there is a small fat containing umbilical hernia. The abdominal wall soft tissues are otherwise unremarkable. The osseous structures are intact. IMPRESSION: No acute intra-abdominal pelvic pathology. IUD device appears displaced and located in the lower uterus. Ultrasound may provide better evaluation pelvic structure and on CT positioned. Clinical correlation is recommended. **An incidental finding of potential clinical significance has been found. A 15 mm hypodense lesion at the dome of the liver, possibly hemangioma. MRI without and with contrast is recommended for further characterization.** Electronically Signed   By: Anner Crete M.D.   On: 12/05/2015 22:00    Scheduled Meds: . cefTRIAXone (ROCEPHIN)  IV  1 g Intravenous Q24H  . famotidine (PEPCID) IV  20 mg Intravenous Q12H  . metronidazole  500 mg Intravenous Q8H  . pantoprazole  40 mg Oral Q1200  . sodium chloride flush  3 mL Intravenous Q12H   Continuous Infusions: . sodium chloride 100 mL/hr at 12/07/15 1308    Principal Problem:   Abdominal pain, acute Active Problems:   Sepsis ([place])   Trichomoniasis   Bacterial vaginosis   Leg edema    Time spent: 30 minutes    Barton Dubois  [facility] Pager [phone]. If 7PM-7AM, please contact night-coverage at [email or web address], password [facility] 12/07/2015, 2:55 PM  LOS: 1 day

## 2015-12-07 NOTE — Progress Notes (Signed)
[facility] Progress Note     Subjective: NAE overnight. Patient states her abdominal pain is improving, currently rates it as a 5/10. She is decreasing her intake of morphine, as it gives her headaches. Last BM was yesterday and was normal. Pt requesting to eat. Denies fever, chills, nausea, vomiting, CP, and SOB.   Objective: Vital signs in last 24 hours: Temp:  [98.1 F (36.7 C)-98.9 F (37.2 C)] 98.1 F (36.7 C) (09/11 0541) Pulse Rate:  [80-96] 84 (09/11 0541) Resp:  [17] 17 (09/11 0541) BP: (116-129)/(67-72) 122/72 (09/11 0541) SpO2:  [99 %-100 %] 99 % (09/11 0541) Last BM Date: 12/06/15  Intake/Output from previous day: 09/10 0701 - 09/11 0700 In: 2950 [I.V.:2500; IV Piggyback:450] Out: [id number] [Urine:1450] Intake/Output this shift: No intake/output data recorded.  PE: Gen:  Pleasant, obese female laying in bed in NAD  Card:  RRR, no M/G/R  Abd: Soft, TTP epigastrium and BL lower quadrants, ND, +BS  Lab Results:   Recent Labs  12/05/15 1741 12/06/15 0613  WBC 11.8* 12.8*  HGB 11.4* 10.7*  HCT 36.4 33.9*  PLT 298 248   BMET  Recent Labs  12/05/15 1741 12/06/15 0613  NA 138 137  K 3.9 3.3*  CL 105 108  CO2 21* 21*  GLUCOSE 93 100*  BUN 7 <5*  CREATININE 0.91 0.79  CALCIUM 8.9 8.0*   PT/INR  Recent Labs  12/06/15 0612  LABPROT 17.7*  INR 1.44   CMP     Component Value Date/Time   NA 137 12/06/2015 0613   K 3.3 (L) 12/06/2015 0613   CL 108 12/06/2015 0613   CO2 21 (L) 12/06/2015 0613   GLUCOSE 100 (H) 12/06/2015 0613   BUN <5 (L) 12/06/2015 0613   CREATININE 0.79 12/06/2015 0613   CALCIUM 8.0 (L) 12/06/2015 0613   PROT 6.4 (L) 12/05/2015 1741   ALBUMIN 3.3 (L) 12/05/2015 1741   AST 20 12/05/2015 1741   ALT 21 12/05/2015 1741   ALKPHOS 83 12/05/2015 1741   BILITOT 1.0 12/05/2015 1741   GFRNONAA >60 12/06/2015 0613   GFRAA >60 12/06/2015 0613   Lipase     Component Value Date/Time   LIPASE 18 12/05/2015 1741        Studies/Results: Ct Abdomen Pelvis W Contrast  Result Date: 12/06/2015 CLINICAL DATA:  Worsening abdominal pain, increasing the BC pain is mostly epigastric EXAM: CT ABDOMEN AND PELVIS WITH CONTRAST TECHNIQUE: Multidetector CT imaging of the abdomen and pelvis was performed using the standard protocol following bolus administration of intravenous contrast. CONTRAST:  129mL ISOVUE-300 IOPAMIDOL (ISOVUE-300) INJECTION 61% COMPARISON:  12/04/2015 FINDINGS: Lower chest: Subsegmental atelectasis is present within the lingula and left lower lobe. No significant pleural effusion. Heart size is nonenlarged. Hepatobiliary: A 1.4 cm low-density lesion is again visualized within the right hepatic lobe. There are no other focal hepatic lesions visualized. Gallbladder within normal limits. No biliary dilatation. Pancreas: Unremarkable. No pancreatic ductal dilatation or surrounding inflammatory changes. Spleen: Normal in size without focal abnormality. Adrenals/Urinary Tract: Adrenal glands are unremarkable. Kidneys are normal, without renal calculi, focal lesion, or hydronephrosis. Bladder is unremarkable. Stomach/Bowel: Stomach is collapsed. No dilated small bowel. Appendix is visualized and is nondilated and contains contrast within its lumen. Vascular/Lymphatic: Aorta is non aneurysmal. Scattered sub cm right lower quadrant lymph nodes. Reproductive: Low-lying IUD in the region of the cervical canal similar compared to prior study. Probable follicle left ovary. Other: Mild subcutaneous stranding over the bilateral hips. , subtle nonspecific hazy  infiltration of the anterior mesentery, most notable in the periumbilical region. Musculoskeletal: No acute osseous abnormality IMPRESSION: 1. Subtle hazy infiltration of the anterior mesentery, most notable in the periumbilical region. Findings are nonspecific and can be seen secondary to edema, inflammation, infection, or neoplasm. 2. Appendix is within normal limits  3. Low-lying intrauterine device seen within the lower uterine segment and cervix 4. 1.4 cm low-density lesion within the dome of the liver ; nonemergent MRI correlation for this finding was previously recommended. Electronically Signed   By: Donavan Foil M.D.   On: 12/06/2015 19:04   Ct Abdomen Pelvis W Contrast  Result Date: 12/05/2015 CLINICAL DATA:  35 year old female with diffuse abdominal pain EXAM: CT ABDOMEN AND PELVIS WITH CONTRAST TECHNIQUE: Multidetector CT imaging of the abdomen and pelvis was performed using the standard protocol following bolus administration of intravenous contrast. CONTRAST:  141mL ISOVUE-300 IOPAMIDOL (ISOVUE-300) INJECTION 61% COMPARISON:  None. FINDINGS: There is mild eventration of the left hemidiaphragm with associated minimal subsegmental atelectasis of the left lung base. The visualized lung bases are otherwise clear. No intra-abdominal free air or free fluid. Indeterminate 15 mm hypodense lesion at the dome of the liver (series 2, image 13), possibly hemangioma. Other etiologies are not excluded. Further evaluation with MRI without and with contrast is recommended. The gallbladder, pancreas, spleen, adrenal glands, kidneys, visualized ureters, and urinary bladder appear unremarkable. Find the uterus is anteverted. An intrauterine device noted which appears displaced and located in the lower uterine and upper cervical region. MRI may provide better evaluation a 1.7 cm hypodense lesion in the left ovary, likely a dominant follicle/ cyst. Caps there is no evidence of bowel obstruction or active inflammation. Normal appendix. The abdominal aorta and IVC appear unremarkable. No portal venous gas identified. There is no adenopathy there is a small fat containing umbilical hernia. The abdominal wall soft tissues are otherwise unremarkable. The osseous structures are intact. IMPRESSION: No acute intra-abdominal pelvic pathology. IUD device appears displaced and located in the  lower uterus. Ultrasound may provide better evaluation pelvic structure and on CT positioned. Clinical correlation is recommended. **An incidental finding of potential clinical significance has been found. A 15 mm hypodense lesion at the dome of the liver, possibly hemangioma. MRI without and with contrast is recommended for further characterization.** Electronically Signed   By: Anner Crete M.D.   On: 12/05/2015 22:00    Anti-infectives: Anti-infectives    Start     Dose/Rate Route Frequency Ordered Stop   12/07/15 0030  cefTRIAXone (ROCEPHIN) 1 g in dextrose 5 % 50 mL IVPB     1 g 100 mL/hr over 30 Minutes Intravenous Every 24 hours 12/06/15 0052     12/06/15 0100  metroNIDAZOLE (FLAGYL) IVPB 500 mg     500 mg 100 mL/hr over 60 Minutes Intravenous Every 8 hours 12/06/15 0054     12/06/15 0000  cefTRIAXone (ROCEPHIN) 1 g in dextrose 5 % 50 mL IVPB     1 g 100 mL/hr over 30 Minutes Intravenous  Once 12/05/15 2349 12/06/15 0103   12/06/15 0000  azithromycin (ZITHROMAX) 500 mg in dextrose 5 % 250 mL IVPB     500 mg 250 mL/hr over 60 Minutes Intravenous  Once 12/05/15 2349 12/06/15 0125       Assessment/Plan Abdominal pain, uncertain etiology - CT scan 12/05/15: no acute intra-abdominal pathology - CT scan repeated 12/06/15: "hazy infiltration of anterior mesentery", possibly secondary to edema/infection - mild leukocytosis: 12.8 - ID: Ceftriaxone, Flagyl  Plan: No acute surgical needs at this time. MD Dyann Kief placed orders to re-check BMP/CBC, we will follow results. Repeat CT scan was negative for acute abdominal pathology, continue non-operative treatment with IV antibiotics for suspected PID and advance patient diet as tolerated. General Surgery will follow.    LOS: 1 day    [facility] 12/07/2015, 10:43 AM Pager: [phone] Consults: [phone] Mon-Fri 7:00 am-4:30 pm Sat-Sun 7:00 am-11:30 am

## 2015-12-08 ENCOUNTER — Telehealth (HOSPITAL_COMMUNITY): Payer: Self-pay

## 2015-12-08 DIAGNOSIS — A549 Gonococcal infection, unspecified: Secondary | ICD-10-CM

## 2015-12-08 DIAGNOSIS — K769 Liver disease, unspecified: Secondary | ICD-10-CM

## 2015-12-08 DIAGNOSIS — K7689 Other specified diseases of liver: Secondary | ICD-10-CM

## 2015-12-08 DIAGNOSIS — N730 Acute parametritis and pelvic cellulitis: Secondary | ICD-10-CM

## 2015-12-08 LAB — GLUCOSE, CAPILLARY: Glucose-Capillary: 103 mg/dL — ABNORMAL HIGH (ref 65–99)

## 2015-12-08 MED ORDER — DIPHENHYDRAMINE HCL 50 MG/ML IJ SOLN
25.0000 mg | Freq: Once | INTRAMUSCULAR | Status: AC
  Administered 2015-12-08: 25 mg via INTRAVENOUS
  Filled 2015-12-08: qty 1

## 2015-12-08 MED ORDER — METOCLOPRAMIDE HCL 5 MG/ML IJ SOLN
10.0000 mg | Freq: Once | INTRAMUSCULAR | Status: AC
  Administered 2015-12-08: 10 mg via INTRAVENOUS
  Filled 2015-12-08: qty 2

## 2015-12-08 MED ORDER — DEXTROSE 5 % IV SOLN
500.0000 mg | Freq: Once | INTRAVENOUS | Status: AC
  Administered 2015-12-08: 500 mg via INTRAVENOUS
  Filled 2015-12-08: qty 500

## 2015-12-08 MED ORDER — METRONIDAZOLE 500 MG PO TABS: 500.0000 mg | ORAL_TABLET | Freq: Three times a day (TID) | ORAL | 0 refills | Status: DC

## 2015-12-08 MED ORDER — KETOROLAC TROMETHAMINE 30 MG/ML IJ SOLN
30.0000 mg | Freq: Once | INTRAMUSCULAR | Status: AC
  Administered 2015-12-08: 30 mg via INTRAVENOUS
  Filled 2015-12-08: qty 1

## 2015-12-08 MED ORDER — DOXYCYCLINE HYCLATE 100 MG PO TABS: 100.0000 mg | ORAL_TABLET | Freq: Two times a day (BID) | ORAL | 0 refills | Status: DC

## 2015-12-08 MED ORDER — FLUCONAZOLE 150 MG PO TABS: 150.0000 mg | ORAL_TABLET | Freq: Every day | ORAL | 0 refills | Status: AC

## 2015-12-08 MED ORDER — SACCHAROMYCES BOULARDII 250 MG PO CAPS: 250.0000 mg | ORAL_CAPSULE | Freq: Two times a day (BID) | ORAL | 0 refills | Status: DC

## 2015-12-08 MED ORDER — FAMOTIDINE 40 MG PO TABS: 40.0000 mg | ORAL_TABLET | Freq: Every day | ORAL | 1 refills | Status: DC

## 2015-12-08 MED ORDER — PANTOPRAZOLE SODIUM 40 MG PO TBEC: 40.0000 mg | DELAYED_RELEASE_TABLET | Freq: Every day | ORAL | 1 refills | Status: DC

## 2015-12-08 NOTE — Discharge Summary (Signed)
Physician Discharge Summary  Christine Alvarado [id number] DOB: April 20, 1980 DOA: 12/05/2015  PCP: Velna Ochs, MD  Admit date: 12/05/2015 Discharge date: 12/08/2015  Time spent: 35 minutes  Recommendations for Outpatient Follow-up:  1. Repeat BMET to follow electrolyte sand renal function  2. Please follow rec's to evaluate liver lesion 3. Make sure patient follow up with gynecologist for further evaluation and treatment of ovarian cyst and dislocation of IUD   Discharge Diagnoses:  Principal Problem:   Abdominal pain, acute Active Problems:   Sepsis ([place])   Trichomoniasis   Bacterial vaginosis   Leg edema   PID (acute pelvic inflammatory disease)   Gonorrhea   Morbid obesity due to excess calories ([place])   Liver lesion   Discharge Condition: stable and improved. Advise to follow up with PCP in 10 days.  Diet recommendation: low calorie diet   Filed Weights   12/05/15 1735 12/05/15 2019 12/06/15 0135  Weight: 131.5 kg (290 lb) 131.5 kg (290 lb) 131.8 kg (290 lb 8 oz)    History of present illness:  As per Dr. Blaine Hamper H&P written on 12/05/15 35 y.o. female with medical history significant of trigeminal nerve disease, morbid obesity, who presents with abdominal pain, fever, leg edema Patient states that she started having abdominal pain since yesterday. Her pain is located in the periumbilical area, constant, 10 out of 10 in severity, nonradiating. It is not aggravated all limited by any known factors. She has nausea, no vomiting or diarrhea. No symptoms of UTI. She is on 5th day of menstruating, but denies vaginal discharge. Patient has a fever and chills. No chest pain, shortness breath, cough, unilateral weakness. She reports bilateral lower leg edema.  ED Course: pt was found to have WBC 11.8, lactate of 1.60, lipase 18, negative pregnancy test, negative urinalysis, electrolytes and renal function okay, Wetprep positive for trichomonas and clue cells, temperature 101.7,  tachycardia, tachypnea. Pt is admitted to telemetry bed as inpatient for further eval and treatment. General surgery, Dr. Kae Heller was consulted.  Hospital Course:  1-abd pain, PID/sepsis: with bacterial vaginosis, positive Gonorrhea and trichomoniasis seen on Wet Prep and GN probe. -received 1 Gram of zithromax as inpatient and will discharge on falgyl and doxycycline to complete a total of 7 days of antibiotics  -patient advise to use condoms for STD prevention and to informed partner to be treated as well -advise to keep himself well hydrated  -sepsis feature resolved  2-obesity: -low calorie diet and exercise discussed with patient -Body mass index is 49.09 kg/m.  3-GERD: -will continue PPI and famotidine  4-Hypokalemia: -repleted and WNL at discharge  5-Possible ovarian cyst and dislocation of IUD:1.7 cm hypodense lesion in the left ovary, likely a dominant follicle/cyst. -out pt follow up  6-Possible liver hemangioma: anincidental finding of a15 mm hypodense lesion at the dome of the liver, possibly hemangioma. -out pt follow up.   Procedures: See below for x-ray reports   Consultations:  General surgery   Discharge Exam: Vitals:   12/08/15 0557 12/08/15 1350  BP: (!) 102/58 117/67  Pulse: 65 66  Resp: 18 18  Temp: 97.9 F (36.6 C) 98.2 F (36.8 C)    General:  Afebrile, no nausea, no vomiting. Expressing significant improvement on her abd pain and tolerating diet/PO meds. Last BM on 12/07/15  Cardiovascular: S1 and S2, no rubs, no gallops, no murmurs  Respiratory: CTA bilaterally  Abdomen: still with mid abdomen/epigastric and lower pelvic area; no guarding, positive rebound on exam.  Musculoskeletal:  no cyanosis or clubbing   Discharge Instructions   Discharge Instructions    Diet - low sodium heart healthy    Complete by:  As directed   Discharge instructions    Complete by:  As directed   Follow low calorie diet Take medications as  prescribed Please keep yourself well hydrated  Please inform your partner about condition for treatment and use condoms for STD prevention     Current Discharge Medication List    START taking these medications   Details  doxycycline (VIBRA-TABS) 100 MG tablet Take 1 tablet (100 mg total) by mouth 2 (two) times daily. Qty: 14 tablet, Refills: 0    famotidine (PEPCID) 40 MG tablet Take 1 tablet (40 mg total) by mouth at bedtime. Qty: 30 tablet, Refills: 1    fluconazole (DIFLUCAN) 150 MG tablet Take 1 tablet (150 mg total) by mouth daily. Qty: 3 tablet, Refills: 0    metroNIDAZOLE (FLAGYL) 500 MG tablet Take 1 tablet (500 mg total) by mouth 3 (three) times daily. Qty: 15 tablet, Refills: 0    pantoprazole (PROTONIX) 40 MG tablet Take 1 tablet (40 mg total) by mouth daily at 12 noon. Qty: 30 tablet, Refills: 1    saccharomyces boulardii (FLORASTOR) 250 MG capsule Take 1 capsule (250 mg total) by mouth 2 (two) times daily. Qty: 60 capsule, Refills: 0      CONTINUE these medications which have NOT CHANGED   Details  ibuprofen (ADVIL,MOTRIN) 200 MG tablet Take 600 mg by mouth every 6 (six) hours as needed for moderate pain.      STOP taking these medications     amoxicillin (AMOXIL) 500 MG capsule      azithromycin (ZITHROMAX Z-PAK) 250 MG tablet      ipratropium (ATROVENT) 0.06 % nasal spray      meclizine (ANTIVERT) 25 MG tablet        Allergies  Allergen Reactions  . Carbamazepine Nausea Only, Rash and Other (See Comments)    Abnormal LFT's also  . Dilantin [Phenytoin] Other (See Comments)    Abnormal visual changes   Follow-up Information    ROSTAND, ROBERT, MD. Schedule an appointment as soon as possible for a visit in 10 day(s).   Specialty:  Internal Medicine Contact information: [id number] [place] [address] [phone]           The results of significant diagnostics from this hospitalization (including imaging, microbiology,  ancillary and laboratory) are listed below for reference.    Significant Diagnostic Studies: Ct Abdomen Pelvis W Contrast  Result Date: 12/06/2015 CLINICAL DATA:  Worsening abdominal pain, increasing the BC pain is mostly epigastric EXAM: CT ABDOMEN AND PELVIS WITH CONTRAST TECHNIQUE: Multidetector CT imaging of the abdomen and pelvis was performed using the standard protocol following bolus administration of intravenous contrast. CONTRAST:  153mL ISOVUE-300 IOPAMIDOL (ISOVUE-300) INJECTION 61% COMPARISON:  12/04/2015 FINDINGS: Lower chest: Subsegmental atelectasis is present within the lingula and left lower lobe. No significant pleural effusion. Heart size is nonenlarged. Hepatobiliary: A 1.4 cm low-density lesion is again visualized within the right hepatic lobe. There are no other focal hepatic lesions visualized. Gallbladder within normal limits. No biliary dilatation. Pancreas: Unremarkable. No pancreatic ductal dilatation or surrounding inflammatory changes. Spleen: Normal in size without focal abnormality. Adrenals/Urinary Tract: Adrenal glands are unremarkable. Kidneys are normal, without renal calculi, focal lesion, or hydronephrosis. Bladder is unremarkable. Stomach/Bowel: Stomach is collapsed. No dilated small bowel. Appendix is visualized and is nondilated and contains  contrast within its lumen. Vascular/Lymphatic: Aorta is non aneurysmal. Scattered sub cm right lower quadrant lymph nodes. Reproductive: Low-lying IUD in the region of the cervical canal similar compared to prior study. Probable follicle left ovary. Other: Mild subcutaneous stranding over the bilateral hips. , subtle nonspecific hazy infiltration of the anterior mesentery, most notable in the periumbilical region. Musculoskeletal: No acute osseous abnormality IMPRESSION: 1. Subtle hazy infiltration of the anterior mesentery, most notable in the periumbilical region. Findings are nonspecific and can be seen secondary to edema,  inflammation, infection, or neoplasm. 2. Appendix is within normal limits 3. Low-lying intrauterine device seen within the lower uterine segment and cervix 4. 1.4 cm low-density lesion within the dome of the liver ; nonemergent MRI correlation for this finding was previously recommended. Electronically Signed   By: Donavan Foil M.D.   On: 12/06/2015 19:04   Ct Abdomen Pelvis W Contrast  Result Date: 12/05/2015 CLINICAL DATA:  35 year old female with diffuse abdominal pain EXAM: CT ABDOMEN AND PELVIS WITH CONTRAST TECHNIQUE: Multidetector CT imaging of the abdomen and pelvis was performed using the standard protocol following bolus administration of intravenous contrast. CONTRAST:  145mL ISOVUE-300 IOPAMIDOL (ISOVUE-300) INJECTION 61% COMPARISON:  None. FINDINGS: There is mild eventration of the left hemidiaphragm with associated minimal subsegmental atelectasis of the left lung base. The visualized lung bases are otherwise clear. No intra-abdominal free air or free fluid. Indeterminate 15 mm hypodense lesion at the dome of the liver (series 2, image 13), possibly hemangioma. Other etiologies are not excluded. Further evaluation with MRI without and with contrast is recommended. The gallbladder, pancreas, spleen, adrenal glands, kidneys, visualized ureters, and urinary bladder appear unremarkable. Find the uterus is anteverted. An intrauterine device noted which appears displaced and located in the lower uterine and upper cervical region. MRI may provide better evaluation a 1.7 cm hypodense lesion in the left ovary, likely a dominant follicle/ cyst. Caps there is no evidence of bowel obstruction or active inflammation. Normal appendix. The abdominal aorta and IVC appear unremarkable. No portal venous gas identified. There is no adenopathy there is a small fat containing umbilical hernia. The abdominal wall soft tissues are otherwise unremarkable. The osseous structures are intact. IMPRESSION: No acute  intra-abdominal pelvic pathology. IUD device appears displaced and located in the lower uterus. Ultrasound may provide better evaluation pelvic structure and on CT positioned. Clinical correlation is recommended. **An incidental finding of potential clinical significance has been found. A 15 mm hypodense lesion at the dome of the liver, possibly hemangioma. MRI without and with contrast is recommended for further characterization.** Electronically Signed   By: Anner Crete M.D.   On: 12/05/2015 22:00    Microbiology: Recent Results (from the past 240 hour(s))  Urine culture     Status: Abnormal   Collection Time: 12/05/15 10:01 PM  Result Value Ref Range Status   Specimen Description URINE, RANDOM  Final   Special Requests ADDED 0206 12/06/15  Final   Culture MULTIPLE SPECIES PRESENT, SUGGEST RECOLLECTION (A)  Final   Report Status 12/07/2015 FINAL  Final  Wet prep, genital     Status: Abnormal   Collection Time: 12/05/15 11:51 PM  Result Value Ref Range Status   Yeast Wet Prep HPF POC NONE SEEN NONE SEEN Final   Trich, Wet Prep PRESENT (A) NONE SEEN Final   Clue Cells Wet Prep HPF POC PRESENT (A) NONE SEEN Final   WBC, Wet Prep HPF POC MANY (A) NONE SEEN Final   Sperm NONE SEEN  Final  Culture, blood (x 2)     Status: None (Preliminary result)   Collection Time: 12/06/15  2:25 AM  Result Value Ref Range Status   Specimen Description BLOOD RIGHT HAND  Final   Special Requests IN PEDIATRIC BOTTLE 1CC  Final   Culture NO GROWTH 2 DAYS  Final   Report Status PENDING  Incomplete  Culture, blood (x 2)     Status: None (Preliminary result)   Collection Time: 12/06/15  2:30 AM  Result Value Ref Range Status   Specimen Description BLOOD RIGHT ARM  Final   Special Requests IN PEDIATRIC BOTTLE 1CC  Final   Culture NO GROWTH 2 DAYS  Final   Report Status PENDING  Incomplete     Labs: Basic Metabolic Panel:  Recent Labs Lab 12/05/15 1741 12/06/15 0613 12/07/15 1218  NA 138 137 138   K 3.9 3.3* 3.7  CL 105 108 106  CO2 21* 21* 21*  GLUCOSE 93 100* 78  BUN 7 <5* 7  CREATININE 0.91 0.79 0.86  CALCIUM 8.9 8.0* 8.4*   Liver Function Tests:  Recent Labs Lab 12/05/15 1741  AST 20  ALT 21  ALKPHOS 83  BILITOT 1.0  PROT 6.4*  ALBUMIN 3.3*    Recent Labs Lab 12/05/15 1741  LIPASE 18   CBC:  Recent Labs Lab 12/05/15 1741 12/06/15 0613 12/07/15 1218  WBC 11.8* 12.8* 7.3  HGB 11.4* 10.7* 10.3*  HCT 36.4 33.9* 33.3*  MCV 85.2 85.8 85.4  PLT 298 248 254   BNP (last 3 results)  Recent Labs  12/06/15 0226  BNP 49.0    CBG:  Recent Labs Lab 12/06/15 0818 12/07/15 0813 12/08/15 0826  GLUCAP 99 73 103*    Signed:  Barton Dubois MD.  [facility] 12/08/2015, 3:33 PM

## 2015-12-08 NOTE — Progress Notes (Signed)
Discharge Note:   Patient alert and oriented x 4 and in no distress.  She was given discharge instructions regarding signs and symptoms to report, STD transmission prevention, medications, diet, activity, and upcoming appointments.  She verbalized understanding of all instructions. Peripheral IV had already been removed. She confirmed that she had all of her personal belongings.  Pt will be transported out via wheelchair by hospital staff when her ride arrives.

## 2015-12-08 NOTE — Progress Notes (Signed)
[facility] Progress Note     Subjective: No issues, slept well on her stomach overnight. Tolerated PO yesterday.   Objective: Vital signs in last 24 hours: Temp:  [97.9 F (36.6 C)-99.5 F (37.5 C)] 97.9 F (36.6 C) (09/12 0557) Pulse Rate:  [65-74] 65 (09/12 0557) Resp:  [18-20] 18 (09/12 0557) BP: (102-114)/(58-68) 102/58 (09/12 0557) SpO2:  [96 %-100 %] 98 % (09/12 0557) Last BM Date: 12/07/15  Intake/Output from previous day: 09/11 0701 - 09/12 0700 In: 3200 [P.O.:300; I.V.:2400; IV Piggyback:500] Out: -  Intake/Output this shift: Total I/O In: 1650 [P.O.:300; I.V.:1200; IV Piggyback:150] Out: -   PE: Gen:  Pleasant, obese female laying in bed in NAD  Card:  RRR, no M/G/R  Abd: Soft, minimally tender, ND  Lab Results:   Recent Labs  12/06/15 0613 12/07/15 1218  WBC 12.8* 7.3  HGB 10.7* 10.3*  HCT 33.9* 33.3*  PLT 248 254   BMET  Recent Labs  12/06/15 0613 12/07/15 1218  NA 137 138  K 3.3* 3.7  CL 108 106  CO2 21* 21*  GLUCOSE 100* 78  BUN <5* 7  CREATININE 0.79 0.86  CALCIUM 8.0* 8.4*   PT/INR  Recent Labs  12/06/15 0612  LABPROT 17.7*  INR 1.44   CMP     Component Value Date/Time   NA 138 12/07/2015 1218   K 3.7 12/07/2015 1218   CL 106 12/07/2015 1218   CO2 21 (L) 12/07/2015 1218   GLUCOSE 78 12/07/2015 1218   BUN 7 12/07/2015 1218   CREATININE 0.86 12/07/2015 1218   CALCIUM 8.4 (L) 12/07/2015 1218   PROT 6.4 (L) 12/05/2015 1741   ALBUMIN 3.3 (L) 12/05/2015 1741   AST 20 12/05/2015 1741   ALT 21 12/05/2015 1741   ALKPHOS 83 12/05/2015 1741   BILITOT 1.0 12/05/2015 1741   GFRNONAA >60 12/07/2015 1218   GFRAA >60 12/07/2015 1218   Lipase     Component Value Date/Time   LIPASE 18 12/05/2015 1741       Studies/Results: Ct Abdomen Pelvis W Contrast  Result Date: 12/06/2015 CLINICAL DATA:  Worsening abdominal pain, increasing the BC pain is mostly epigastric EXAM: CT ABDOMEN AND PELVIS WITH CONTRAST  TECHNIQUE: Multidetector CT imaging of the abdomen and pelvis was performed using the standard protocol following bolus administration of intravenous contrast. CONTRAST:  174mL ISOVUE-300 IOPAMIDOL (ISOVUE-300) INJECTION 61% COMPARISON:  12/04/2015 FINDINGS: Lower chest: Subsegmental atelectasis is present within the lingula and left lower lobe. No significant pleural effusion. Heart size is nonenlarged. Hepatobiliary: A 1.4 cm low-density lesion is again visualized within the right hepatic lobe. There are no other focal hepatic lesions visualized. Gallbladder within normal limits. No biliary dilatation. Pancreas: Unremarkable. No pancreatic ductal dilatation or surrounding inflammatory changes. Spleen: Normal in size without focal abnormality. Adrenals/Urinary Tract: Adrenal glands are unremarkable. Kidneys are normal, without renal calculi, focal lesion, or hydronephrosis. Bladder is unremarkable. Stomach/Bowel: Stomach is collapsed. No dilated small bowel. Appendix is visualized and is nondilated and contains contrast within its lumen. Vascular/Lymphatic: Aorta is non aneurysmal. Scattered sub cm right lower quadrant lymph nodes. Reproductive: Low-lying IUD in the region of the cervical canal similar compared to prior study. Probable follicle left ovary. Other: Mild subcutaneous stranding over the bilateral hips. , subtle nonspecific hazy infiltration of the anterior mesentery, most notable in the periumbilical region. Musculoskeletal: No acute osseous abnormality IMPRESSION: 1. Subtle hazy infiltration of the anterior mesentery, most notable in the periumbilical region. Findings are nonspecific and  can be seen secondary to edema, inflammation, infection, or neoplasm. 2. Appendix is within normal limits 3. Low-lying intrauterine device seen within the lower uterine segment and cervix 4. 1.4 cm low-density lesion within the dome of the liver ; nonemergent MRI correlation for this finding was previously  recommended. Electronically Signed   By: Donavan Foil M.D.   On: 12/06/2015 19:04    Anti-infectives: Anti-infectives    Start     Dose/Rate Route Frequency Ordered Stop   12/07/15 0030  cefTRIAXone (ROCEPHIN) 1 g in dextrose 5 % 50 mL IVPB     1 g 100 mL/hr over 30 Minutes Intravenous Every 24 hours 12/06/15 0052     12/06/15 0100  metroNIDAZOLE (FLAGYL) IVPB 500 mg     500 mg 100 mL/hr over 60 Minutes Intravenous Every 8 hours 12/06/15 0054     12/06/15 0000  cefTRIAXone (ROCEPHIN) 1 g in dextrose 5 % 50 mL IVPB     1 g 100 mL/hr over 30 Minutes Intravenous  Once 12/05/15 2349 12/06/15 0103   12/06/15 0000  azithromycin (ZITHROMAX) 500 mg in dextrose 5 % 250 mL IVPB     500 mg 250 mL/hr over 60 Minutes Intravenous  Once 12/05/15 2349 12/06/15 0125       Assessment/Plan Abdominal pain, uncertain etiology - CT scan 12/05/15: no acute intra-abdominal pathology - CT scan repeated 12/06/15: "hazy infiltration of anterior mesentery", possibly secondary to edema/infection - mild leukocytosis: 12.8 - ID: Ceftriaxone, Flagyl   Plan: No acute surgical needs at this time. Continue supportive care and empiric tx for PID. Please call surery with questions or concerns at any time.    LOS: 2 days    [facility] 12/08/2015, 6:14 AM

## 2015-12-08 NOTE — Care Management Note (Signed)
Case Management Note  Patient Details  Name: Christine Alvarado MRN: [id number] Date of Birth: [birth year]  Subjective/Objective:                 Spoke with patient at the bedside. Patient states that she gets medications at [facility] or [facility]. Reviewed $4 med list at [facility]. Patient DC'd with Z Pack, and amoxicillin. Coupons provided. Patient asked for difulcan Rx, message relayed to Dr Dyann Kief.   Action/Plan:  Anticipate DC today to home, no further CM needs.  Expected Discharge Date:  12/08/15               Expected Discharge Plan:  Home/Self Care  In-House Referral:  NA  Discharge planning Services  CM Consult, Medication Assistance  Post Acute Care Choice:  NA Choice offered to:  NA  DME Arranged:  N/A DME Agency:  NA  HH Arranged:  NA HH Agency:  NA  Status of Service:  Completed, signed off  If discussed at [place] of Stay Meetings, dates discussed:    Additional Comments:  Carles Collet, RN 12/08/2015, 2:45 PM

## 2015-12-08 NOTE — Telephone Encounter (Signed)
Results received from [facility].  (+) gonorrhea Pt inpt.   Given Rocephin IV 1,000 mg x 2 and  Zithromax IV 500 mg.  Per attending MD, he will notify pt of (+) results  and see to it that pt receives addl Zithromax 500 mg prior to DC  [facility] form completed and faxed.

## 2015-12-11 LAB — CULTURE, BLOOD (ROUTINE X 2)
CULTURE: NO GROWTH
CULTURE: NO GROWTH

## 2015-12-17 ENCOUNTER — Encounter: Payer: Self-pay | Admitting: Obstetrics & Gynecology

## 2015-12-17 ENCOUNTER — Ambulatory Visit (INDEPENDENT_AMBULATORY_CARE_PROVIDER_SITE_OTHER): Payer: Self-pay | Admitting: Obstetrics & Gynecology

## 2015-12-17 VITALS — BP 95/57 | HR 78 | Wt 289.2 lb

## 2015-12-17 DIAGNOSIS — R102 Pelvic and perineal pain: Secondary | ICD-10-CM

## 2015-12-17 DIAGNOSIS — Z01419 Encounter for gynecological examination (general) (routine) without abnormal findings: Secondary | ICD-10-CM

## 2015-12-17 DIAGNOSIS — T8332XA Displacement of intrauterine contraceptive device, initial encounter: Secondary | ICD-10-CM

## 2015-12-17 DIAGNOSIS — Z124 Encounter for screening for malignant neoplasm of cervix: Secondary | ICD-10-CM

## 2015-12-17 DIAGNOSIS — Z1151 Encounter for screening for human papillomavirus (HPV): Secondary | ICD-10-CM

## 2015-12-17 NOTE — Progress Notes (Signed)
   Subjective:    Patient ID: Christine Alvarado, female    DOB: [birth year], 35 y.o.   MRN: [id number]  HPI 35 yo S AA lady here for follow up after a CT done earlier this month for abdominopelvic pain showed her IUD to be low in the uterus. She was treated for trich and + GC at that time. She is having sex with her boyfriend and using condoms. She says that he is "getting treated".     Review of Systems She has used Mirena since her last child's birth 14 years ago. This IUD was placed in [place], [place] about 4 years ago.    Objective:   Physical Exam  Morbidly obese BFNAD Conversing normally Vagina/cervix appear normal, IUD string seen      Assessment & Plan:  Low lying IUD on CT- Schedule u/s to confirm placement of IUD Pap smear with cotesting done today

## 2015-12-18 LAB — CYTOLOGY - PAP

## 2015-12-22 ENCOUNTER — Ambulatory Visit (HOSPITAL_COMMUNITY)
Admission: RE | Admit: 2015-12-22 | Discharge: 2015-12-22 | Disposition: A | Discharge status: Home / Self Care | Payer: Medicaid Other | Source: Ambulatory Visit | Attending: Obstetrics & Gynecology | Admitting: Obstetrics & Gynecology

## 2015-12-22 ENCOUNTER — Telehealth: Payer: Self-pay

## 2015-12-22 DIAGNOSIS — R109 Unspecified abdominal pain: Secondary | ICD-10-CM | POA: Diagnosis present

## 2015-12-22 DIAGNOSIS — T8332XA Displacement of intrauterine contraceptive device, initial encounter: Secondary | ICD-10-CM | POA: Insufficient documentation

## 2015-12-22 DIAGNOSIS — N839 Noninflammatory disorder of ovary, fallopian tube and broad ligament, unspecified: Secondary | ICD-10-CM | POA: Insufficient documentation

## 2015-12-22 NOTE — Telephone Encounter (Signed)
Patient informed of test results. She will call back to scheduled her next pap smear next year.

## 2016-02-04 ENCOUNTER — Ambulatory Visit (INDEPENDENT_AMBULATORY_CARE_PROVIDER_SITE_OTHER): Payer: Medicaid Other | Admitting: Obstetrics & Gynecology

## 2016-02-04 VITALS — BP 131/86 | HR 75 | Wt 289.4 lb

## 2016-02-04 DIAGNOSIS — Z30433 Encounter for removal and reinsertion of intrauterine contraceptive device: Secondary | ICD-10-CM | POA: Diagnosis present

## 2016-02-04 DIAGNOSIS — A549 Gonococcal infection, unspecified: Secondary | ICD-10-CM

## 2016-02-04 DIAGNOSIS — T8332XD Displacement of intrauterine contraceptive device, subsequent encounter: Secondary | ICD-10-CM

## 2016-02-04 LAB — POCT PREGNANCY, URINE: PREG TEST UR: NEGATIVE

## 2016-02-04 MED ORDER — LEVONORGESTREL 18.6 MCG/DAY IU IUD
INTRAUTERINE_SYSTEM | Freq: Once | INTRAUTERINE | Status: AC
  Administered 2016-02-04: 1 via INTRAUTERINE

## 2016-02-04 NOTE — Progress Notes (Signed)
   Subjective:    Patient ID: Christine Alvarado, female    DOB: 10/03/1980, 35 y.o.   MRN: [id number]  HPI 35 yo S AA P2 (36 and 61 yo daughters) here because her Mirena is too low. She does not want more kids.   Review of Systems     Objective:   Physical Exam Pleasant morbidly obese BFNAD Breathing, conversing, and ambulating normally UPT negative, consent signed, Time out procedure done. Cervix prepped with betadine and grasped with a single tooth tenaculum. Mirena removed easily and noted to be intact. Liletta was easily placed and the strings were cut to 3-4 cm. Uterus sounded to 9 cm. She tolerated the procedure well.          Assessment & Plan:  H/O + GC- TOC today Contraception- Liletta RTC 4 weeks for string check

## 2016-12-20 ENCOUNTER — Encounter (HOSPITAL_COMMUNITY): Payer: Self-pay | Admitting: Emergency Medicine

## 2016-12-20 DIAGNOSIS — Z5321 Procedure and treatment not carried out due to patient leaving prior to being seen by health care provider: Secondary | ICD-10-CM | POA: Diagnosis not present

## 2016-12-20 NOTE — ED Triage Notes (Signed)
Pt from home with c/o abrasions under and on bilateral breasts. Pt denies injury and denies any new bras or clothes. Pt states she noticed areas when she was showering yesterday. Pt rates pain 8/10 and reports drainage on the area when she changed the bandage last

## 2016-12-21 ENCOUNTER — Emergency Department (HOSPITAL_COMMUNITY)
Admission: EM | Admit: 2016-12-21 | Discharge: 2016-12-21 | Discharge status: Against Medical Advice | Payer: BLUE CROSS/BLUE SHIELD | Attending: Emergency Medicine | Admitting: Emergency Medicine

## 2016-12-21 NOTE — ED Notes (Signed)
No responce

## 2016-12-28 ENCOUNTER — Ambulatory Visit (INDEPENDENT_AMBULATORY_CARE_PROVIDER_SITE_OTHER): Payer: BLUE CROSS/BLUE SHIELD

## 2016-12-28 DIAGNOSIS — Z113 Encounter for screening for infections with a predominantly sexual mode of transmission: Secondary | ICD-10-CM

## 2016-12-28 DIAGNOSIS — N898 Other specified noninflammatory disorders of vagina: Secondary | ICD-10-CM | POA: Diagnosis not present
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
## 2016-12-29 LAB — CERVICOVAGINAL ANCILLARY ONLY
BACTERIAL VAGINITIS: POSITIVE — AB
Candida vaginitis: POSITIVE — AB
Chlamydia: NEGATIVE
NEISSERIA GONORRHEA: NEGATIVE
Trichomonas: POSITIVE — AB

## 2017-01-03 ENCOUNTER — Other Ambulatory Visit: Payer: Self-pay | Admitting: General Practice

## 2017-01-03 ENCOUNTER — Telehealth: Payer: Self-pay

## 2017-01-03 MED ORDER — TINIDAZOLE 250 MG PO TABS: ORAL_TABLET | ORAL | 0 refills | Status: DC

## 2017-01-03 MED ORDER — FLUCONAZOLE 150 MG PO TABS: 150.0000 mg | ORAL_TABLET | Freq: Once | ORAL | 0 refills | Status: AC

## 2017-01-03 NOTE — Telephone Encounter (Signed)
Patient has been notified of results and a prescription for Tindamax 2 grams PO 1 dose (take with food) also a prescription for Diflucan 150 mg PO 1 dose per Clovia Cuff, MD has been sent to [facility], patient verbalized understanding

## 2017-01-03 NOTE — Telephone Encounter (Signed)
-----   Message from Emily Filbert, MD sent at 01/02/2017 10:11 AM EDT ----- She is + for trich, BV, and yeast. Can you treat her for trich, make sure that she knows that partner needs treatment, and give her diflucan also. Thanks

## 2017-09-14 ENCOUNTER — Encounter (HOSPITAL_COMMUNITY): Payer: Self-pay | Admitting: Emergency Medicine

## 2017-09-14 ENCOUNTER — Emergency Department (HOSPITAL_COMMUNITY): Payer: BLUE CROSS/BLUE SHIELD

## 2017-09-14 ENCOUNTER — Emergency Department (HOSPITAL_COMMUNITY)
Admission: EM | Admit: 2017-09-14 | Discharge: 2017-09-14 | Disposition: A | Discharge status: Home / Self Care | Payer: BLUE CROSS/BLUE SHIELD | Attending: Emergency Medicine | Admitting: Emergency Medicine

## 2017-09-14 ENCOUNTER — Other Ambulatory Visit: Payer: Self-pay

## 2017-09-14 DIAGNOSIS — Z79899 Other long term (current) drug therapy: Secondary | ICD-10-CM | POA: Insufficient documentation

## 2017-09-14 DIAGNOSIS — G500 Trigeminal neuralgia: Secondary | ICD-10-CM | POA: Diagnosis not present

## 2017-09-14 DIAGNOSIS — G43109 Migraine with aura, not intractable, without status migrainosus: Secondary | ICD-10-CM

## 2017-09-14 LAB — I-STAT CHEM 8, ED
BUN: 6 mg/dL (ref 6–20)
Calcium, Ion: 1.15 mmol/L (ref 1.15–1.40)
Chloride: 104 mmol/L (ref 101–111)
Creatinine, Ser: 1 mg/dL (ref 0.44–1.00)
Glucose, Bld: 86 mg/dL (ref 65–99)
HEMATOCRIT: 38 % (ref 36.0–46.0)
HEMOGLOBIN: 12.9 g/dL (ref 12.0–15.0)
POTASSIUM: 4.1 mmol/L (ref 3.5–5.1)
Sodium: 140 mmol/L (ref 135–145)
TCO2: 27 mmol/L (ref 22–32)

## 2017-09-14 LAB — CBC
HCT: 40.4 % (ref 36.0–46.0)
Hemoglobin: 12.6 g/dL (ref 12.0–15.0)
MCH: 27.2 pg (ref 26.0–34.0)
MCHC: 31.2 g/dL (ref 30.0–36.0)
MCV: 87.3 fL (ref 78.0–100.0)
Platelets: 286 10*3/uL (ref 150–400)
RBC: 4.63 MIL/uL (ref 3.87–5.11)
RDW: 13.5 % (ref 11.5–15.5)
WBC: 4.5 10*3/uL (ref 4.0–10.5)

## 2017-09-14 LAB — COMPREHENSIVE METABOLIC PANEL
ALK PHOS: 64 U/L (ref 38–126)
ALT: 18 U/L (ref 14–54)
AST: 19 U/L (ref 15–41)
Albumin: 3.2 g/dL — ABNORMAL LOW (ref 3.5–5.0)
Anion gap: 7 (ref 5–15)
BUN: 6 mg/dL (ref 6–20)
CHLORIDE: 105 mmol/L (ref 101–111)
CO2: 28 mmol/L (ref 22–32)
Calcium: 8.8 mg/dL — ABNORMAL LOW (ref 8.9–10.3)
Creatinine, Ser: 1.06 mg/dL — ABNORMAL HIGH (ref 0.44–1.00)
GFR calc Af Amer: >60 mL/min (ref >60)
GLUCOSE: 89 mg/dL (ref 65–99)
Potassium: 4.2 mmol/L (ref 3.5–5.1)
SODIUM: 140 mmol/L (ref 135–145)
TOTAL PROTEIN: 5.7 g/dL — AB (ref 6.5–8.1)
Total Bilirubin: 0.7 mg/dL (ref 0.3–1.2)

## 2017-09-14 LAB — RAPID URINE DRUG SCREEN, HOSP PERFORMED
Amphetamines: NOT DETECTED
BENZODIAZEPINES: NOT DETECTED
COCAINE: NOT DETECTED
Opiates: NOT DETECTED
TETRAHYDROCANNABINOL: NOT DETECTED

## 2017-09-14 LAB — DIFFERENTIAL
Abs Immature Granulocytes: 0 10*3/uL (ref 0.0–0.1)
BASOS ABS: 0 10*3/uL (ref 0.0–0.1)
BASOS PCT: 0 %
EOS PCT: 6 %
Eosinophils Absolute: 0.3 10*3/uL (ref 0.0–0.7)
Immature Granulocytes: 0 %
LYMPHS PCT: 36 %
Lymphs Abs: 1.6 10*3/uL (ref 0.7–4.0)
MONO ABS: 0.5 10*3/uL (ref 0.1–1.0)
MONOS PCT: 10 %
NEUTROS PCT: 48 %
Neutro Abs: 2.1 10*3/uL (ref 1.7–7.7)

## 2017-09-14 LAB — URINALYSIS, ROUTINE W REFLEX MICROSCOPIC
Bilirubin Urine: NEGATIVE
GLUCOSE, UA: NEGATIVE mg/dL
HGB URINE DIPSTICK: NEGATIVE
KETONES UR: 5 mg/dL — AB
Leukocytes, UA: NEGATIVE
Nitrite: NEGATIVE
Protein, ur: NEGATIVE mg/dL
Specific Gravity, Urine: 1.016 (ref 1.005–1.030)
pH: 6 (ref 5.0–8.0)

## 2017-09-14 LAB — I-STAT TROPONIN, ED: Troponin i, poc: 0 ng/mL (ref 0.00–0.08)

## 2017-09-14 LAB — I-STAT BETA HCG BLOOD, ED (MC, WL, AP ONLY): I-stat hCG, quantitative: <5 m[IU]/mL (ref <5)

## 2017-09-14 LAB — ETHANOL

## 2017-09-14 LAB — APTT: APTT: 28 s (ref 24–36)

## 2017-09-14 LAB — PROTIME-INR
INR: 1.03
Prothrombin Time: 13.4 seconds (ref 11.4–15.2)

## 2017-09-14 MED ORDER — METOCLOPRAMIDE HCL 5 MG/ML IJ SOLN
10.0000 mg | Freq: Once | INTRAMUSCULAR | Status: AC
  Administered 2017-09-14: 10 mg via INTRAVENOUS
  Filled 2017-09-14: qty 2

## 2017-09-14 MED ORDER — DEXAMETHASONE SODIUM PHOSPHATE 10 MG/ML IJ SOLN
10.0000 mg | Freq: Once | INTRAMUSCULAR | Status: AC
  Administered 2017-09-14: 10 mg via INTRAVENOUS
  Filled 2017-09-14: qty 1

## 2017-09-14 MED ORDER — DIPHENHYDRAMINE HCL 50 MG/ML IJ SOLN
25.0000 mg | Freq: Once | INTRAMUSCULAR | Status: AC
  Administered 2017-09-14: 25 mg via INTRAVENOUS
  Filled 2017-09-14: qty 1

## 2017-09-14 MED ORDER — SODIUM CHLORIDE 0.9 % IV BOLUS
1000.0000 mL | Freq: Once | INTRAVENOUS | Status: AC
  Administered 2017-09-14: 1000 mL via INTRAVENOUS

## 2017-09-14 MED ORDER — SODIUM CHLORIDE 0.9 % IV SOLN
INTRAVENOUS | Status: DC
  Administered 2017-09-14: 19:00:00 via INTRAVENOUS

## 2017-09-14 NOTE — ED Notes (Signed)
Patient given something to eat and rink per edp approval.

## 2017-09-14 NOTE — ED Notes (Signed)
Per edp no stroke swallow screen needed

## 2017-09-14 NOTE — Discharge Instructions (Addendum)
Follow-up with your neurologist.  Go home and rest.  Symptoms may have been related to migraine.  Return for any new or worse symptoms.  Head CT MRI brain here today without any acute findings.  Work note provided.

## 2017-09-14 NOTE — ED Triage Notes (Signed)
Per [facility] patient coming from work complaining of left sided facial droop, with tingling the face and headache that began last night at 9 pm and has gotten worse since then. Hx of trigeminal neuralgia. Patient alert and oriented x4. No other neuro deficits with ems

## 2017-09-14 NOTE — Consult Note (Signed)
Requesting Physician: Dr. Rogene Houston    Chief Complaint: left facial droop   History obtained from: Patient and Chart     HPI:                                                                                                                                       Christine Alvarado is an 37 y.o. female with trigeminal neuralgia presents with left facial droop and headache that began since yesterday.   Patient has Trigeminal neuralgia that has progressively been getting worse and is on Lamictal. Recently increased dosage. Yesterday night patient started have numbness and increase pain over left face and noticed her left face was drooping. She did not want to go to the ER, however today her boss called EMS and told her to go the hospital.     Past Medical History:  Diagnosis Date  . Nerve disorder   . Trigeminal nerve disease     Past Surgical History:  Procedure Laterality Date  . BRAIN SURGERY    . OTHER SURGICAL HISTORY     microvascular decompression of left side of brain    Family History  Problem Relation Age of Onset  . Hypertension Mother   . Hyperlipidemia Mother   . Heart disease Father   . Hyperlipidemia Father   . Stroke Father   . Diabetes Mellitus I Sister    Social History:  reports that she has never smoked. She has never used smokeless tobacco. She reports that she drinks alcohol. She reports that she does not use drugs.  Allergies:  Allergies  Allergen Reactions  . Carbamazepine Nausea Only, Rash and Other (See Comments)    Abnormal LFT's also  . Dilantin [Phenytoin] Other (See Comments)    Abnormal visual changes    Medications:                                                                                                                        I reviewed home medications   ROS:  14 systems reviewed and negative except above     Examination:                                                                                                      General: Appears well-developed and well-nourished.  Psych: Affect appropriate to situation Eyes: No scleral injection HENT: No OP obstrucion Head: Normocephalic.  Cardiovascular: Normal rate and regular rhythm.  Respiratory: Effort normal and breath sounds normal to anterior ascultation GI: Soft.  No distension. There is no tenderness.  Skin: WDI   Neurological Examination Mental Status: Alert, oriented, thought content appropriate.  Speech fluent without evidence of aphasia. Able to follow 3 step commands without difficulty. Cranial Nerves: II: Visual fields grossly normal,  III,IV, VI: ptosis not present, extra-ocular motions intact bilaterally, pupils equal, round, reactive to light and accommodation V,VII: There appears to be drooping of her left lower face, however moves while speaking. The right half of face feels it is moving less, however appears to be similar when compared to her photo.  VIII: hearing normal bilaterally IX,X: uvula rises symmetrically XI: bilateral shoulder shrug XII: midline tongue extension Motor: Right : Upper extremity   5/5    Left:     Upper extremity   5/5  Lower extremity   5/5     Lower extremity   5/5 Tone and bulk:normal tone throughout; no atrophy noted Sensory: Pinprick and light touch intact throughout, bilaterally Deep Tendon Reflexes: 2+ and symmetric throughout Plantars: Right: downgoing   Left: downgoing Cerebellar: normal finger-to-nose, normal rapid alternating movements and normal heel-to-shin test Gait: normal gait and station     Lab Results: Basic Metabolic Panel: Recent Labs  Lab 09/14/17 1241 09/14/17 1323  NA 140 140  K 4.2 4.1  CL 105 104  CO2 28  --   GLUCOSE 89 86  BUN 6 6  CREATININE 1.06* 1.00  CALCIUM 8.8*  --     CBC: Recent Labs  Lab 09/14/17 1241 09/14/17 1323  WBC 4.5  --    NEUTROABS 2.1  --   HGB 12.6 12.9  HCT 40.4 38.0  MCV 87.3  --   PLT 286  --     Coagulation Studies: Recent Labs    09/14/17 1241  LABPROT 13.4  INR 1.03    Imaging: Ct Head Wo Contrast  Result Date: 09/14/2017 CLINICAL DATA:  Left facial droop, tingling in face, and headache since last night at 2100 hours, worsened symptoms since, history of trigeminal neuralgia EXAM: CT HEAD WITHOUT CONTRAST TECHNIQUE: Contiguous axial images were obtained from the base of the skull through the vertex without intravenous contrast. COMPARISON:  02/08/2014 FINDINGS: Brain: Normal ventricular morphology. No midline shift or mass effect. Scattered calcifications throughout falx. Normal appearance of brain parenchyma. No intracranial hemorrhage, mass lesion or evidence of acute infarction. No extra-axial fluid collections. Vascular: Vascular structures unremarkable Skull: Intact Sinuses/Orbits: Clear Other: N/A IMPRESSION: Normal exam. If patient has persistent symptoms, recommend MR assessment. Electronically Signed   By: Lavonia Dana M.D.   On: 09/14/2017 14:07   Mr Brain  Wo Contrast (neuro Protocol)  Result Date: 09/14/2017 CLINICAL DATA:  Focal neuro deficit.  Left facial droop.  Headache EXAM: MRI HEAD WITHOUT CONTRAST TECHNIQUE: Multiplanar, multiecho pulse sequences of the brain and surrounding structures were obtained without intravenous contrast. COMPARISON:  CT head 09/14/2017 FINDINGS: Brain: Negative for acute infarct. Two small hyperintensities left frontal white matter. Otherwise the white matter is normal. Brainstem and cerebellum and basal ganglia normal. Negative for hemorrhage mass or fluid collection Vascular: Normal arterial flow voids Skull and upper cervical spine: Negative Sinuses/Orbits: Negative Other: None IMPRESSION: No acute abnormality. Two small hyperintensities left frontal white matter are nonspecific and could be seen with chronic ischemia or migraine headache. Demyelinating  disease not likely. Electronically Signed   By: Franchot Gallo M.D.   On: 09/14/2017 17:40     ASSESSMENT AND PLAN  37 y.o. female with trigeminal neuralgia presents with left facial droop and headache that began since yesterday. MRI negative for acute stroke.  Suspect complicated migraine vs possibly swelling of face from trigeminal neuralgia making it appear as left facial droop.  Reassured patient this is not a stroke. Recommended migraine cocktail for headache. Follow up with outpatient neurologist..    Mkenzie Dotts [facility] Pager Number [phone]

## 2017-09-14 NOTE — ED Provider Notes (Signed)
[facility] EMERGENCY DEPARTMENT Provider Note   CSN: [id number] Arrival date & time: 09/14/17  1233     History   Chief Complaint Chief Complaint  Patient presents with  . Facial Droop    HPI Christine Alvarado is a 37 y.o. female.  Patient has a long-standing history of left-sided trigeminal neuralgia.  Followed by neurology at [facility].  Patient is coming from work complaining of left-sided facial droop.  With tingling to the left side of the face.  The tingling to the face and the pain in the left side of face is consistent with her trigeminal neuralgia.  Patient started with a headache last evening somewhere between 9 PM and 10 PM but the facial droop started at 9 PM.  Patient felt fine earlier in the day.  The headache is on the right side of her head.     Past Medical History:  Diagnosis Date  . Nerve disorder   . Trigeminal nerve disease     Patient Active Problem List   Diagnosis Date Noted  . PID (acute pelvic inflammatory disease)   . Gonorrhea   . Morbid obesity due to excess calories ([place])   . Liver lesion   . Abdominal pain, acute 12/06/2015  . Sepsis ([place]) 12/06/2015  . Trichomoniasis 12/06/2015  . Bacterial vaginosis 12/06/2015  . Leg edema 12/06/2015  . Generalized abdominal pain     Past Surgical History:  Procedure Laterality Date  . BRAIN SURGERY    . OTHER SURGICAL HISTORY     microvascular decompression of left side of brain     OB History   None      Home Medications    Prior to Admission medications   Medication Sig Start Date End Date Taking? Authorizing Provider  LamoTRIgine 100 MG TB24 24 hour tablet Take 100 mg by mouth daily.   Yes [provider]  meloxicam (MOBIC) 15 MG tablet Take 15 mg by mouth as needed. 07/10/17  Yes [provider]  naproxen sodium (ALEVE) 220 MG tablet Take 220 mg by mouth daily as needed.   Yes [provider]  doxycycline (VIBRA-TABS) 100 MG tablet Take 1  tablet (100 mg total) by mouth 2 (two) times daily. Patient not taking: Reported on 02/04/2016 12/08/15   Barton Dubois, MD  famotidine (PEPCID) 40 MG tablet Take 1 tablet (40 mg total) by mouth at bedtime. Patient not taking: Reported on 02/04/2016 12/08/15   Barton Dubois, MD  metroNIDAZOLE (FLAGYL) 500 MG tablet Take 1 tablet (500 mg total) by mouth 3 (three) times daily. Patient not taking: Reported on 02/04/2016 12/08/15   Barton Dubois, MD  pantoprazole (PROTONIX) 40 MG tablet Take 1 tablet (40 mg total) by mouth daily at 12 noon. Patient not taking: Reported on 02/04/2016 12/08/15   Barton Dubois, MD  saccharomyces boulardii (FLORASTOR) 250 MG capsule Take 1 capsule (250 mg total) by mouth 2 (two) times daily. Patient not taking: Reported on 02/04/2016 12/08/15   Barton Dubois, MD  tinidazole (TINDAMAX) 250 MG tablet Take 2 grams PO 1 dose (take with food) Patient not taking: Reported on 09/14/2017 01/03/17   Emily Filbert, MD    Family History Family History  Problem Relation Age of Onset  . Hypertension Mother   . Hyperlipidemia Mother   . Heart disease Father   . Hyperlipidemia Father   . Stroke Father   . Diabetes Mellitus I Sister     Social History Social History  Tobacco Use  . Smoking status: Never Smoker  . Smokeless tobacco: Never Used  Substance Use Topics  . Alcohol use: Yes    Comment: occ  . Drug use: No     Allergies   Carbamazepine and Dilantin [phenytoin]   Review of Systems Review of Systems  Constitutional: Negative for fever.  HENT: Negative for congestion and drooling.   Eyes: Negative for visual disturbance.  Respiratory: Negative for shortness of breath.   Cardiovascular: Negative for chest pain.  Gastrointestinal: Negative for abdominal pain, nausea and vomiting.  Genitourinary: Negative for dysuria.  Musculoskeletal: Negative for back pain and neck pain.  Skin: Negative for rash.  Neurological: Positive for facial asymmetry, weakness,  numbness and headaches. Negative for syncope, speech difficulty and light-headedness.  Hematological: Does not bruise/bleed easily.  Psychiatric/Behavioral: Negative for confusion.     Physical Exam Updated Vital Signs BP 132/80   Pulse (!) 55   Temp 98.6 F (37 C) (Oral)   Resp 16   Ht 1.626 m (5\' 4" )   Wt 128.4 kg (283 lb)   LMP 08/18/2017   SpO2 100%   BMI 48.58 kg/m   Physical Exam  Constitutional: She is oriented to person, place, and time. She appears well-developed and well-nourished.  HENT:  Head: Normocephalic and atraumatic.  Mouth/Throat: Oropharynx is clear and moist.  Eyes: Pupils are equal, round, and reactive to light. Conjunctivae and EOM are normal.  Neck: Normal range of motion. Neck supple.  Cardiovascular: Normal rate and regular rhythm.  Pulmonary/Chest: Effort normal and breath sounds normal.  Abdominal: Soft. Bowel sounds are normal. There is no tenderness.  Musculoskeletal: Normal range of motion. She exhibits no edema.  Neurological: She is alert and oriented to person, place, and time. A cranial nerve deficit is present. No sensory deficit. She exhibits normal muscle tone. Coordination abnormal.  Patient with right-sided facial weakness on exam.  Forehead either side without weakness.  Patient has her usual trigeminal pain to the left side of her face.  Face appears asymmetric on the left side.  Which is strange because the weakness seems to be on the right side.  Patient also had right arm drift on exam otherwise no obvious other neuro focal deficits.  Skin: Skin is warm.     ED Treatments / Results  Labs (all labs ordered are listed, but only abnormal results are displayed) Labs Reviewed  COMPREHENSIVE METABOLIC PANEL - Abnormal; Notable for the following components:      Result Value   Creatinine, Ser 1.06 (*)    Calcium 8.8 (*)    Total Protein 5.7 (*)    Albumin 3.2 (*)    All other components within normal limits  URINALYSIS, ROUTINE W  REFLEX MICROSCOPIC - Abnormal; Notable for the following components:   APPearance HAZY (*)    Ketones, ur 5 (*)    All other components within normal limits  ETHANOL  PROTIME-INR  APTT  CBC  DIFFERENTIAL  RAPID URINE DRUG SCREEN, HOSP PERFORMED  I-STAT CHEM 8, ED  I-STAT TROPONIN, ED  I-STAT BETA HCG BLOOD, ED ([facility], [facility], [facility] ONLY)    EKG EKG Interpretation  Date/Time:  Thursday September 14 2017 12:43:48 EDT Ventricular Rate:  60 PR Interval:    QRS Duration: 99 QT Interval:  413 QTC Calculation: 413 R Axis:   47 Text Interpretation:  Sinus rhythm Confirmed by Fredia Sorrow [phone]) on 09/14/2017 12:50:56 PM Also confirmed by Fredia Sorrow [phone])  on 09/14/2017 1:37:52 PM Also confirmed  by Fredia Sorrow [phone]), editor Lynder Parents [phone])  on 09/14/2017 3:09:52 PM   Radiology Ct Head Wo Contrast  Result Date: 09/14/2017 CLINICAL DATA:  Left facial droop, tingling in face, and headache since last night at 2100 hours, worsened symptoms since, history of trigeminal neuralgia EXAM: CT HEAD WITHOUT CONTRAST TECHNIQUE: Contiguous axial images were obtained from the base of the skull through the vertex without intravenous contrast. COMPARISON:  02/08/2014 FINDINGS: Brain: Normal ventricular morphology. No midline shift or mass effect. Scattered calcifications throughout falx. Normal appearance of brain parenchyma. No intracranial hemorrhage, mass lesion or evidence of acute infarction. No extra-axial fluid collections. Vascular: Vascular structures unremarkable Skull: Intact Sinuses/Orbits: Clear Other: N/A IMPRESSION: Normal exam. If patient has persistent symptoms, recommend MR assessment. Electronically Signed   By: Lavonia Dana M.D.   On: 09/14/2017 14:07   Mr Brain Wo Contrast (neuro Protocol)  Result Date: 09/14/2017 CLINICAL DATA:  Focal neuro deficit.  Left facial droop.  Headache EXAM: MRI HEAD WITHOUT CONTRAST TECHNIQUE: Multiplanar, multiecho pulse sequences of the brain and  surrounding structures were obtained without intravenous contrast. COMPARISON:  CT head 09/14/2017 FINDINGS: Brain: Negative for acute infarct. Two small hyperintensities left frontal white matter. Otherwise the white matter is normal. Brainstem and cerebellum and basal ganglia normal. Negative for hemorrhage mass or fluid collection Vascular: Normal arterial flow voids Skull and upper cervical spine: Negative Sinuses/Orbits: Negative Other: None IMPRESSION: No acute abnormality. Two small hyperintensities left frontal white matter are nonspecific and could be seen with chronic ischemia or migraine headache. Demyelinating disease not likely. Electronically Signed   By: Franchot Gallo M.D.   On: 09/14/2017 17:40    Procedures Procedures (including critical care time)  CRITICAL CARE Performed by: Fredia Sorrow Total critical care time: 30 minutes Critical care time was exclusive of separately billable procedures and treating other patients. Critical care was necessary to treat or prevent imminent or life-threatening deterioration. Critical care was time spent personally by me on the following activities: development of treatment plan with patient and/or surrogate as well as nursing, discussions with consultants, evaluation of patient's response to treatment, examination of patient, obtaining history from patient or surrogate, ordering and performing treatments and interventions, ordering and review of laboratory studies, ordering and review of radiographic studies, pulse oximetry and re-evaluation of patient's condition.   Medications Ordered in ED Medications  0.9 %  sodium chloride infusion ( Intravenous New Bag/Given 09/14/17 1830)  sodium chloride 0.9 % bolus 1,000 mL (0 mLs Intravenous Stopped 09/14/17 2022)  dexamethasone (DECADRON) injection 10 mg (10 mg Intravenous Given 09/14/17 1833)  diphenhydrAMINE (BENADRYL) injection 25 mg (25 mg Intravenous Given 09/14/17 1837)  metoCLOPramide (REGLAN)  injection 10 mg (10 mg Intravenous Given 09/14/17 1832)     Initial Impression / Assessment and Plan / ED Course  I have reviewed the triage vital signs and the nursing notes.  Pertinent labs & imaging results that were available during my care of the patient were reviewed by me and considered in my medical decision making (see chart for details).    Patient with a little bit of confusing facial presentation.  Forehead is spared there is asymmetry to the left side of the face patient with her standard trigeminal neuralgia pain on the left side of face has a right-sided headache patient thinks the left side of mouth is drooping but on exam it appears that it may be right-sided.  Patient also had a pronator drift to her right arm.  Head CT negative MRI  pending we will go ahead and consult neurology patient out of the stroke window with onset of symptoms at 9 PM last evening.  Patient without any's significant neurofocal deficits no significant arm weakness or leg weakness no speech problems no visual problems.  Not consistent with a van type stroke.  Patient seen by neurology.  Also MRI brain without any acute findings.  They felt that symptoms could be related to a complicated migraine.  Patient given migraine cocktail with Reglan Benadryl and Decadron and IV fluids and headache completely resolved and the facial asymmetry also completely resolved.  Still had her left-sided trigeminal nerve pain which is been present chronically all in all it makes it seem that her symptoms were due to a complicated migraine her new symptoms that is.  Patient discharged home work note provided she will follow back up with her neurologist.   Final Clinical Impressions(s) / ED Diagnoses   Final diagnoses:  Trigeminal neuralgia  Complicated migraine    ED Discharge Orders    None       Fredia Sorrow, MD 09/14/17 2224

## 2017-09-14 NOTE — ED Notes (Signed)
Patient transported to MRI 

## 2018-03-26 ENCOUNTER — Emergency Department (HOSPITAL_COMMUNITY)
Admission: EM | Admit: 2018-03-26 | Discharge: 2018-03-27 | Disposition: A | Discharge status: Home / Self Care | Payer: BC Managed Care – PPO | Attending: Emergency Medicine | Admitting: Emergency Medicine

## 2018-03-26 ENCOUNTER — Other Ambulatory Visit: Payer: Self-pay

## 2018-03-26 ENCOUNTER — Encounter (HOSPITAL_COMMUNITY): Payer: Self-pay | Admitting: Emergency Medicine

## 2018-03-26 ENCOUNTER — Emergency Department (HOSPITAL_COMMUNITY): Payer: BC Managed Care – PPO

## 2018-03-26 DIAGNOSIS — M659 Synovitis and tenosynovitis, unspecified: Secondary | ICD-10-CM | POA: Diagnosis not present

## 2018-03-26 DIAGNOSIS — Z79899 Other long term (current) drug therapy: Secondary | ICD-10-CM | POA: Diagnosis not present

## 2018-03-26 DIAGNOSIS — M79644 Pain in right finger(s): Secondary | ICD-10-CM | POA: Diagnosis present

## 2018-03-26 NOTE — ED Triage Notes (Signed)
Patient is complaining of right hand pain that started this afternoon. Patient states that she does not remember her hand being injured. Patient states it is throbbing and she cant move her thumb.

## 2018-03-27 MED ORDER — MELOXICAM 15 MG PO TABS: 15.0000 mg | ORAL_TABLET | Freq: Every day | ORAL | 0 refills | Status: DC

## 2018-03-27 NOTE — ED Provider Notes (Signed)
[facility] DEPT Provider Note   CSN: [id number] Arrival date & time: 03/26/18  2301     History   Chief Complaint Chief Complaint  Patient presents with  . Hand Pain    HPI Christine Alvarado is a 37 y.o. female.  Who presents emergency department chief complaint of right thumb pain.  She is right-hand dominant.  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  She states that the pain started today.  She describes it as throbbing, radiating into the right forearm.  Worse when she moves her thumb and better when she keeps it still.  She has noticed some heat but no swelling, no known injuries, no fevers or chills.  She is never had anything like this before.  She denies any locking or catching of the thumb.  HPI  Past Medical History:  Diagnosis Date  . Nerve disorder   . Trigeminal nerve disease     Patient Active Problem List   Diagnosis Date Noted  . PID (acute pelvic inflammatory disease)   . Gonorrhea   . Morbid obesity due to excess calories ([place])   . Liver lesion   . Abdominal pain, acute 12/06/2015  . Sepsis ([place]) 12/06/2015  . Trichomoniasis 12/06/2015  . Bacterial vaginosis 12/06/2015  . Leg edema 12/06/2015  . Generalized abdominal pain     Past Surgical History:  Procedure Laterality Date  . BRAIN SURGERY    . OTHER SURGICAL HISTORY     microvascular decompression of left side of brain     OB History   No obstetric history on file.      Home Medications    Prior to Admission medications   Medication Sig Start Date End Date Taking? Authorizing Provider  doxycycline (VIBRA-TABS) 100 MG tablet Take 1 tablet (100 mg total) by mouth 2 (two) times daily. Patient not taking: Reported on 02/04/2016 12/08/15   Barton Dubois, MD  famotidine (PEPCID) 40 MG tablet Take 1 tablet (40 mg total) by mouth at bedtime. Patient not taking: Reported on 02/04/2016 12/08/15   Barton Dubois, MD  LamoTRIgine 100 MG TB24 24 hour tablet  Take 100 mg by mouth daily.    [provider]  meloxicam (MOBIC) 15 MG tablet Take 1 tablet (15 mg total) by mouth daily. 03/27/18   Olumide Dolinger, Vernie Shanks, PA-C  metroNIDAZOLE (FLAGYL) 500 MG tablet Take 1 tablet (500 mg total) by mouth 3 (three) times daily. Patient not taking: Reported on 02/04/2016 12/08/15   Barton Dubois, MD  naproxen sodium (ALEVE) 220 MG tablet Take 220 mg by mouth daily as needed.    [provider]  pantoprazole (PROTONIX) 40 MG tablet Take 1 tablet (40 mg total) by mouth daily at 12 noon. Patient not taking: Reported on 02/04/2016 12/08/15   Barton Dubois, MD  saccharomyces boulardii (FLORASTOR) 250 MG capsule Take 1 capsule (250 mg total) by mouth 2 (two) times daily. Patient not taking: Reported on 02/04/2016 12/08/15   Barton Dubois, MD  tinidazole (TINDAMAX) 250 MG tablet Take 2 grams PO 1 dose (take with food) Patient not taking: Reported on 09/14/2017 01/03/17   Emily Filbert, MD    Family History Family History  Problem Relation Age of Onset  . Hypertension Mother   . Hyperlipidemia Mother   . Heart disease Father   . Hyperlipidemia Father   . Stroke Father   . Diabetes Mellitus I Sister     Social History Social History   Tobacco Use  .  Smoking status: Never Smoker  . Smokeless tobacco: Never Used  Substance Use Topics  . Alcohol use: Yes    Comment: occ  . Drug use: No     Allergies   Carbamazepine; Dilantin [phenytoin]; and Tape   Review of Systems Review of Systems Ten systems reviewed and are negative for acute change, except as noted in the HPI.    Physical Exam Updated Vital Signs BP 111/61 (BP Location: Right Arm)   Pulse 67   Temp 98.3 F (36.8 C) (Oral)   Resp 18   Ht 5\' 4"  (1.626 m)   Wt 127.2 kg   LMP 03/19/2018   SpO2 100%   BMI 48.13 kg/m   Physical Exam Vitals signs and nursing note reviewed.  Constitutional:      General: She is not in acute distress.    Appearance: She is well-developed. She is  not diaphoretic.  HENT:     Head: Normocephalic and atraumatic.  Eyes:     General: No scleral icterus.    Conjunctiva/sclera: Conjunctivae normal.  Neck:     Musculoskeletal: Normal range of motion.  Cardiovascular:     Rate and Rhythm: Normal rate and regular rhythm.     Heart sounds: Normal heart sounds. No murmur. No friction rub. No gallop.   Pulmonary:     Effort: Pulmonary effort is normal. No respiratory distress.     Breath sounds: Normal breath sounds.  Abdominal:     General: Bowel sounds are normal. There is no distension.     Palpations: Abdomen is soft. There is no mass.     Tenderness: There is no abdominal tenderness. There is no guarding.  Musculoskeletal:        General: Tenderness present. No swelling or deformity.     Comments: Patient with tenderness on both sides of the thumb.  Tenderness in the right forearm extensors.  Patient is able to move the thumb at each joint however it is painful.  Mild warmth without erythema or swelling suggestive of infection  Skin:    General: Skin is warm and dry.  Neurological:     Mental Status: She is alert and oriented to person, place, and time.  Psychiatric:        Behavior: Behavior normal.      ED Treatments / Results  Labs (all labs ordered are listed, but only abnormal results are displayed) Labs Reviewed - No data to display  EKG None  Radiology Dg Hand Complete Right  Result Date: 03/26/2018 CLINICAL DATA:  Acute onset of right hand pain, particularly at the base of the thumb. EXAM: RIGHT HAND - COMPLETE 3+ VIEW COMPARISON:  None. FINDINGS: There is no evidence of fracture or dislocation. The joint spaces are preserved. The carpal rows are intact, and demonstrate normal alignment. The soft tissues are unremarkable in appearance. IMPRESSION: No evidence of fracture or dislocation. Electronically Signed   By: Garald Balding M.D.   On: 03/26/2018 23:56    Procedures Procedures (including critical care  time)  Medications Ordered in ED Medications - No data to display   Initial Impression / Assessment and Plan / ED Course  I have reviewed the triage vital signs and the nursing notes.  Pertinent labs & imaging results that were available during my care of the patient were reviewed by me and considered in my medical decision making (see chart for details).     Patient with right thumb pain.  Suspect inflammatory tenosynovitis.  Although the patient  does have a history of pelvic inflammatory disease I doubt this represents gonococcal arthritis or septic joint.  It does not appear infected.  Patient be placed in thumb spica, anti-inflammatories, outpatient follow-up with orthopedic hand specialist.  Final Clinical Impressions(s) / ED Diagnoses   Final diagnoses:  Tenosynovitis of thumb    ED Discharge Orders         Ordered    meloxicam (MOBIC) 15 MG tablet  Daily     03/27/18 0039           Margarita Mail, PA-C 03/27/18 0056    Molpus, Jenny Reichmann, MD 03/27/18 [phone]

## 2018-03-27 NOTE — Discharge Instructions (Addendum)
Contact a health care provider if: Your symptoms are not improving or are getting worse. You have a fever and more of any of the following symptoms: Pain. Redness. Warmth. Swelling. Get help right away if: Your fingers or toes become numb or turn blue.

## 2018-04-07 ENCOUNTER — Encounter (HOSPITAL_COMMUNITY): Payer: Self-pay | Admitting: Emergency Medicine

## 2018-04-07 ENCOUNTER — Emergency Department (HOSPITAL_COMMUNITY): Payer: BC Managed Care – PPO

## 2018-04-07 ENCOUNTER — Emergency Department (HOSPITAL_COMMUNITY)
Admission: EM | Admit: 2018-04-07 | Discharge: 2018-04-07 | Disposition: A | Discharge status: Home / Self Care | Payer: BC Managed Care – PPO | Attending: Emergency Medicine | Admitting: Emergency Medicine

## 2018-04-07 DIAGNOSIS — S6991XA Unspecified injury of right wrist, hand and finger(s), initial encounter: Secondary | ICD-10-CM | POA: Diagnosis present

## 2018-04-07 DIAGNOSIS — S63501A Unspecified sprain of right wrist, initial encounter: Secondary | ICD-10-CM | POA: Diagnosis not present

## 2018-04-07 DIAGNOSIS — Z79899 Other long term (current) drug therapy: Secondary | ICD-10-CM | POA: Insufficient documentation

## 2018-04-07 DIAGNOSIS — Y9389 Activity, other specified: Secondary | ICD-10-CM | POA: Insufficient documentation

## 2018-04-07 DIAGNOSIS — Y92410 Unspecified street and highway as the place of occurrence of the external cause: Secondary | ICD-10-CM | POA: Diagnosis not present

## 2018-04-07 DIAGNOSIS — Y999 Unspecified external cause status: Secondary | ICD-10-CM | POA: Insufficient documentation

## 2018-04-07 MED ORDER — HYDROCODONE-ACETAMINOPHEN 5-325 MG PO TABS
1.0000 | ORAL_TABLET | Freq: Once | ORAL | Status: AC
  Administered 2018-04-07: 1 via ORAL
  Filled 2018-04-07: qty 1

## 2018-04-07 MED ORDER — CYCLOBENZAPRINE HCL 10 MG PO TABS: 10.0000 mg | ORAL_TABLET | Freq: Two times a day (BID) | ORAL | 0 refills | Status: DC | PRN

## 2018-04-07 MED ORDER — NAPROXEN 500 MG PO TABS: 500.0000 mg | ORAL_TABLET | Freq: Two times a day (BID) | ORAL | 0 refills | Status: DC
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
## 2018-04-07 NOTE — Discharge Instructions (Addendum)
Do not drive while taking the muscle relaxer as it will make you sleepy. Follow up with your doctor if symptoms persist over the next week.

## 2018-05-03 ENCOUNTER — Encounter: Payer: Self-pay | Admitting: Neurology

## 2018-05-04 ENCOUNTER — Other Ambulatory Visit: Payer: Self-pay | Admitting: *Deleted

## 2018-05-04 DIAGNOSIS — G5603 Carpal tunnel syndrome, bilateral upper limbs: Secondary | ICD-10-CM

## 2018-05-15 ENCOUNTER — Ambulatory Visit (INDEPENDENT_AMBULATORY_CARE_PROVIDER_SITE_OTHER): Payer: BC Managed Care – PPO | Admitting: Neurology

## 2018-05-15 DIAGNOSIS — G5603 Carpal tunnel syndrome, bilateral upper limbs: Secondary | ICD-10-CM

## 2018-05-15 DIAGNOSIS — G5601 Carpal tunnel syndrome, right upper limb: Secondary | ICD-10-CM

## 2018-05-15 NOTE — Procedures (Addendum)
[facility]rology  [address]  [place], [place] [address] Tel: [phone] Fax:  [phone] Test Date:  05/15/2018  Patient: Christine Alvarado DOB: 1980-10-16 Physician: Narda Amber, DO  Sex: Female Height: 5\' 4"  Ref Phys: Leanora Cover, MD  ID#: [id number] Temp: 36.0C Technician:    Patient Complaints: This is a 38 year old female referred for evaluation of right hand numbness.  NCV & EMG Findings: Extensive electrodiagnostic testing of the right upper extremity and additional studies of the left shows:  1. Right mixed palmar sensory response shows prolonged latency.  Bilateral median, ulnar, and left mixed palmar sensory responses are within normal limits. 2. Bilateral median and ulnar motor responses are within normal limits. 3. There is no evidence of active or chronic motor axonal loss changes affecting any of the tested muscles.  Motor unit configuration and recruitment pattern is within normal limits.  Impression: Right median neuropathy at or distal to the wrist, consistent with a clinical diagnosis of carpal tunnel syndrome, which is very mild in degree electrically.   ___________________________ Narda Amber, DO    Nerve Conduction Studies Anti Sensory Summary Table   Site NR Peak (ms) Norm Peak (ms) P-T Amp (V) Norm P-T Amp  Left Median Anti Sensory (2nd Digit)  36C  Wrist    3.0 <3.4 33.3 >20  Right Median Anti Sensory (2nd Digit)  36C  Wrist    3.3 <3.4 34.3 >20  Left Ulnar Anti Sensory (5th Digit)  36C  Wrist    2.5 <3.1 45.4 >12  Right Ulnar Anti Sensory (5th Digit)  36C  Wrist    2.6 <3.1 41.0 >12   Motor Summary Table   Site NR Onset (ms) Norm Onset (ms) O-P Amp (mV) Norm O-P Amp Site1 Site2 Delta-0 (ms) Dist (cm) Vel (m/s) Norm Vel (m/s)  Left Median Motor (Abd Poll Brev)  36C  Wrist    2.7 <3.9 9.4 >6 Elbow Wrist 5.0 29.0 58 >50  Elbow    7.7  8.5         Right Median Motor (Abd Poll Brev)  36C  Wrist    2.7 <3.9 11.0 >6 Elbow  Wrist 4.6 29.0 63 >50  Elbow    7.3  11.0         Right Ulnar Motor (Abd Dig Minimi)  36C  Wrist    1.7 <3.1 9.5 >7 B Elbow Wrist 3.9 25.0 64 >50  B Elbow    5.6  8.3  A Elbow B Elbow 1.5 10.0 67 >50  A Elbow    7.1  8.0         Left Ulnar Motor (Abd Dig Minimi)  36C  Wrist    1.5 <3.1 9.5 >7 B Elbow Wrist 3.3 21.0 64 >50  B Elbow    4.8  8.7  A Elbow B Elbow 1.7 10.0 59 >50  A Elbow    6.5  8.8          Comparison Summary Table   Site NR Peak (ms) Norm Peak (ms) P-T Amp (V) Site1 Site2 Delta-P (ms) Norm Delta (ms)  Left Median/Ulnar Palm Comparison (Wrist - 8cm)  36C  Median Palm    1.7 <2.2 39.3 Median Palm Ulnar Palm 0.3   Ulnar Palm    1.4 <2.2 27.8      Right Median/Ulnar Palm Comparison (Wrist - 8cm)  36C  Median Palm    1.8 <2.2 53.7 Median Palm Ulnar Palm 0.5   Ulnar  Palm    1.3 <2.2 22.5       EMG   Side Muscle Ins Act Fibs Psw Fasc Number Recrt Dur Dur. Amp Amp. Poly Poly. Comment  Right 1stDorInt Nml Nml Nml Nml Nml Nml Nml Nml Nml Nml Nml Nml N/A  Right Abd Poll Brev Nml Nml Nml Nml Nml Nml Nml Nml Nml Nml Nml Nml N/A  Right PronatorTeres Nml Nml Nml Nml Nml Nml Nml Nml Nml Nml Nml Nml N/A  Right Biceps Nml Nml Nml Nml Nml Nml Nml Nml Nml Nml Nml Nml N/A  Right Triceps Nml Nml Nml Nml Nml Nml Nml Nml Nml Nml Nml Nml N/A  Right Deltoid Nml Nml Nml Nml Nml Nml Nml Nml Nml Nml Nml Nml N/A      Waveforms:

## 2018-12-05 ENCOUNTER — Telehealth: Payer: Self-pay | Admitting: Obstetrics & Gynecology

## 2018-12-05 NOTE — Telephone Encounter (Signed)
The patient called in regarding her IUD stated she thinks its time for it to be removed. She also stated she would like an annual. Informed the patient of the mirena being replaced every 5 years. Informed of the upcoming appointment.

## 2019-01-14 ENCOUNTER — Other Ambulatory Visit: Payer: Self-pay

## 2019-01-14 ENCOUNTER — Ambulatory Visit (INDEPENDENT_AMBULATORY_CARE_PROVIDER_SITE_OTHER): Payer: Self-pay

## 2019-01-14 DIAGNOSIS — N898 Other specified noninflammatory disorders of vagina: Secondary | ICD-10-CM

## 2019-01-14 DIAGNOSIS — Z113 Encounter for screening for infections with a predominantly sexual mode of transmission: Secondary | ICD-10-CM

## 2019-01-14 DIAGNOSIS — B373 Candidiasis of vulva and vagina: Secondary | ICD-10-CM

## 2019-01-14 NOTE — Progress Notes (Signed)
I reviewed the above documentation and agree with the plan as above

## 2019-01-14 NOTE — Progress Notes (Signed)
Pt here today for self swab.  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 Pt explained on how to obtain a self swab and that her results will come back in 24-48 hours.  Pt explained that we will call with abnormal results.  Pt verbalized understanding with no further questions.   Diasia Henken,RN  01/14/19

## 2019-01-18 ENCOUNTER — Other Ambulatory Visit: Payer: Self-pay | Admitting: Family Medicine

## 2019-01-18 LAB — CERVICOVAGINAL ANCILLARY ONLY
Candida Glabrata: NEGATIVE
Candida Vaginitis: POSITIVE — AB
Chlamydia: POSITIVE — AB
Comment: NEGATIVE
Comment: NEGATIVE
Comment: NEGATIVE
Comment: NEGATIVE
Comment: NEGATIVE
Comment: NORMAL
Neisseria Gonorrhea: NEGATIVE
Trichomonas: NEGATIVE

## 2019-01-18 MED ORDER — FLUCONAZOLE 100 MG PO TABS: 100.0000 mg | ORAL_TABLET | Freq: Every day | ORAL | 0 refills | Status: DC

## 2019-01-18 MED ORDER — AZITHROMYCIN 500 MG PO TABS: 500.0000 mg | ORAL_TABLET | Freq: Every day | ORAL | 0 refills | Status: AC

## 2019-01-18 NOTE — Progress Notes (Signed)
Patient positive for Chlamydia and Candida, scripts sent for azithromycin and diflucan. Will need TOC for chlamydia 4 weeks after tx, and her partner needs to be treated as well.

## 2019-01-21 ENCOUNTER — Telehealth: Payer: Self-pay

## 2019-01-21 ENCOUNTER — Other Ambulatory Visit: Payer: Self-pay

## 2019-01-21 NOTE — Telephone Encounter (Signed)
Called pt to advise of Test results & medication that was sent. Pt sated received the info thru My Chart & has already taken the meds. Boyfriend has been notified, she stted already has an appointment set up also.

## 2019-01-21 NOTE — Progress Notes (Unsigned)
STD CARD sent to [facility].

## 2019-01-24 ENCOUNTER — Ambulatory Visit: Payer: BC Managed Care – PPO | Admitting: Obstetrics & Gynecology

## 2019-02-06 ENCOUNTER — Other Ambulatory Visit: Payer: Self-pay

## 2019-02-06 ENCOUNTER — Other Ambulatory Visit (HOSPITAL_COMMUNITY)
Admission: RE | Admit: 2019-02-06 | Discharge: 2019-02-06 | Disposition: A | Discharge status: Home / Self Care | Payer: BC Managed Care – PPO | Source: Ambulatory Visit | Attending: Obstetrics & Gynecology | Admitting: Obstetrics & Gynecology

## 2019-02-06 ENCOUNTER — Encounter: Payer: Self-pay | Admitting: Obstetrics & Gynecology

## 2019-02-06 ENCOUNTER — Ambulatory Visit (INDEPENDENT_AMBULATORY_CARE_PROVIDER_SITE_OTHER): Payer: BC Managed Care – PPO | Admitting: Obstetrics & Gynecology

## 2019-02-06 VITALS — BP 106/63 | HR 82 | Wt 245.3 lb

## 2019-02-06 DIAGNOSIS — Z01419 Encounter for gynecological examination (general) (routine) without abnormal findings: Secondary | ICD-10-CM | POA: Insufficient documentation

## 2019-02-06 NOTE — Progress Notes (Signed)
Subjective:    Christine Alvarado is a 38 y.o. engaged P2 (19 and 65 daughters) who presents for an annual exam. The patient has no complaints today. She has monthly periods, lasting about 5 days, would like STI testing. The patient is sexually active. GYN screening history: last pap: was normal. The patient wears seatbelts: yes. The patient participates in regular exercise: yes. Has the patient ever been transfused or tattooed?: yes. The patient reports that there is not domestic violence in her life.   Menstrual History: OB History   No obstetric history on file.     Menarche age: 38 Patient's last menstrual period was 02/01/2019 (approximate).    The following portions of the patient's history were reviewed and updated as appropriate: allergies, current medications, past family history, past medical history, past social history, past surgical history and problem list.  Review of Systems Pertinent items are noted in HPI.   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   Objective:    BP 106/63   Pulse 82   Wt 245 lb 4.8 oz (111.3 kg)   LMP 02/01/2019 (Approximate)   BMI 42.11 kg/m   General Appearance:    Alert, cooperative, no distress, appears stated age  Head:    Normocephalic, without obvious abnormality, atraumatic  Eyes:    PERRL, conjunctiva/corneas clear, EOM's intact, fundi    benign, both eyes  Ears:    Normal TM's and external ear canals, both ears  Nose:   Nares normal, septum midline, mucosa normal, no drainage    or sinus tenderness  Throat:   Lips, mucosa, and tongue normal; teeth and gums normal  Neck:   Supple, symmetrical, trachea midline, no adenopathy;    thyroid:  no enlargement/tenderness/nodules; no carotid   bruit or JVD   Back:     Symmetric, no curvature, ROM normal, no CVA tenderness  Lungs:     Clear to auscultation bilaterally, respirations unlabored  Chest Wall:    No tenderness or deformity   Heart:    Regular rate and rhythm, S1 and S2 normal, no murmur, rub   or gallop  Breast Exam:    No tenderness, masses, or nipple abnormality  Abdomen:     Soft, non-tender, bowel sounds active all four quadrants,    no masses, no organomegaly  Genitalia:    Normal female without lesion, discharge or tenderness, 3cm long Liletta strings seen, small amount of bright red blood,  normal size and shape, anteverted, mobile, non-tender, normal adnexal exam      Extremities:   Extremities normal, atraumatic, no cyanosis or edema  Pulses:   2+ and symmetric all extremities  Skin:   Skin color, texture, turgor normal, no rashes or lesions  Lymph nodes:   Cervical, supraclavicular, and axillary nodes normal  Neurologic:   CNII-XII intact, normal strength, sensation and reflexes    throughout  .    Assessment:    Healthy female exam.    Plan:     Thin prep Pap smear. with cotesting STI testing She will come back in 4 weeks for TOC for + CT about 2 weeks ago.

## 2019-02-07 LAB — HEPATITIS B SURFACE ANTIGEN: Hepatitis B Surface Ag: NEGATIVE

## 2019-02-07 LAB — RPR: RPR Ser Ql: NONREACTIVE

## 2019-02-07 LAB — HEPATITIS C ANTIBODY: Hep C Virus Ab: 0.1 s/co ratio (ref 0.0–0.9)

## 2019-02-07 LAB — HIV ANTIBODY (ROUTINE TESTING W REFLEX): HIV Screen 4th Generation wRfx: NONREACTIVE

## 2019-02-08 LAB — CYTOLOGY - PAP
Comment: NEGATIVE
Diagnosis: UNDETERMINED — AB
High risk HPV: NEGATIVE

## 2019-03-06 ENCOUNTER — Ambulatory Visit (INDEPENDENT_AMBULATORY_CARE_PROVIDER_SITE_OTHER): Payer: BC Managed Care – PPO | Admitting: General Practice

## 2019-03-06 ENCOUNTER — Other Ambulatory Visit: Payer: Self-pay

## 2019-03-06 ENCOUNTER — Other Ambulatory Visit (HOSPITAL_COMMUNITY)
Admission: RE | Admit: 2019-03-06 | Discharge: 2019-03-06 | Disposition: A | Discharge status: Home / Self Care | Payer: BC Managed Care – PPO | Source: Ambulatory Visit | Attending: Family Medicine | Admitting: Family Medicine

## 2019-03-06 DIAGNOSIS — Z8619 Personal history of other infectious and parasitic diseases: Secondary | ICD-10-CM

## 2019-03-06 NOTE — Progress Notes (Signed)
Patient presents to office today for test of cure due to recent positive chlamydia test. Patient reports her and her partner have been treated. Instructed patient in self swab and specimen collected. Discussed results will be back in 24-48 hours and we will contact her with any abnormal results. Patient verbalized understanding & had no questions.  Koren Bound RN BSN 03/06/19

## 2019-03-08 LAB — GC/CHLAMYDIA PROBE AMP (~~LOC~~) NOT AT ARMC
Chlamydia: NEGATIVE
Comment: NEGATIVE
Comment: NORMAL
Neisseria Gonorrhea: NEGATIVE

## 2019-03-14 ENCOUNTER — Encounter: Payer: Self-pay | Admitting: *Deleted

## 2019-03-16 ENCOUNTER — Telehealth: Payer: BC Managed Care – PPO | Admitting: Physician Assistant

## 2019-03-16 DIAGNOSIS — Z20822 Contact with and (suspected) exposure to covid-19: Secondary | ICD-10-CM

## 2019-03-16 NOTE — Progress Notes (Signed)
I have spent 5 minutes in review of e-visit questionnaire, review and updating patient chart, medical decision making and response to patient.   Krislynn Gronau Cody Oline Belk, PA-C    

## 2019-03-16 NOTE — Progress Notes (Signed)
E-Visit for Corona Virus Screening   Your current symptoms could be consistent with the coronavirus.  Many health care providers can now test patients at their office but not all are.  [facility] has multiple testing sites. For information on our [place] testing locations and hours go to [email or web address]  We are enrolling you in our [place] for [place] . Daily you will receive a questionnaire within the [place] website. Our COVID 19 response team will be monitoring your responses daily.  Testing Information: The COVID-19 Community Testing sites will begin testing BY APPOINTMENT ONLY.  You can schedule online at [email or web address]  If you do not have access to a smart phone or computer you may call [phone] for an appointment.  Testing Locations: Appointment schedule is 8 am to 3:30 pm at all sites  [facility] indoors at [address] [place] [address] [facility]  indoors at [place]. [address] [place], [place] [address] [place] indoors at [address], [place] [address]  Additional testing sites in the Community:  . For [facility] Testing sites in [facility]  FaceUpdate.uy  . For Pop-up testing sites in [place]  BowlDirectory.co.uk  . For Testing sites with regular hours https://onsms.org/[place]/  . For [place] [email or web address].si  . For [facility] BasicJet.[email or web address]  . For [facility] testing in [place] and [employer] [email or web address]  . For [facility] testing in [facility]   https://lhi.care/covidtesting  For  more  information about community testing call [phone]   We are enrolling you in our [place] for [place] . Daily you will receive a questionnaire within the [place] website. Our COVID 19 response team will be monitoring your responses daily.  Please quarantine yourself while awaiting your test results. If you develop fever/cough/breathlessness, please stay home for 10 days with improving symptoms and until you have had 24 hours of no fever (without taking a fever reducer).  You should wear a mask or cloth face covering over your nose and mouth if you must be around other people or animals, including pets (even at home). Try to stay at least 6 feet away from other people. This will protect the people around you.  Please continue good preventive care measures, including:  frequent hand-washing, avoid touching your face, cover coughs/sneezes, stay out of crowds and keep a 6 foot distance from others.  COVID-19 is a respiratory illness with symptoms that are similar to the flu. Symptoms are typically mild to moderate, but there have been cases of severe illness and death due to the virus.   The following symptoms may appear 2-14 days after exposure: . Fever . Cough . Shortness of breath or difficulty breathing . Chills . Repeated shaking with chills . Muscle pain . Headache . Sore throat . New loss of taste or smell . Fatigue . Congestion or runny nose . Nausea or vomiting . Diarrhea  Go to the nearest hospital ED for assessment if fever/cough/breathlessness are severe or illness seems like a threat to life.  It is vitally important that if you feel that you have an infection such as this virus or any other virus that you stay home and away from places where you may spread it to others.  You should avoid contact with people age 5 and older l  You may also take acetaminophen (Tylenol) as needed for fever.  Reduce your risk of any infection by using the same precautions used for  avoiding the common cold or flu:  Marland Kitchen Wash your hands often with soap and warm water for at least 20 seconds.  If soap and water are not readily available, use an alcohol-based hand sanitizer with at least 60% alcohol.  . If coughing or sneezing, cover your mouth and nose by coughing or sneezing into the elbow areas of your shirt or coat, into a tissue or into your sleeve (not your hands). . Avoid shaking hands with others and consider head nods or verbal greetings only. . Avoid touching your eyes, nose, or mouth with unwashed hands.  . Avoid close contact with people who are sick. . Avoid places or events with large numbers of people in one location, like concerts or sporting events. . Carefully consider travel plans you have or are making. . If you are planning any travel outside or inside the [place], visit the CDC's Travelers' Health webpage for the latest health notices. . If you have some symptoms but not all symptoms, continue to monitor at home and seek medical attention if your symptoms worsen. . If you are having a medical emergency, call 911.  HOME CARE . Only take medications as instructed by your medical team. . Drink plenty of fluids and get plenty of rest. . A steam or ultrasonic humidifier can help if you have congestion.   GET HELP RIGHT AWAY IF YOU HAVE EMERGENCY WARNING SIGNS** FOR COVID-19. If you or someone is showing any of these signs seek emergency medical care immediately. Call 911 or proceed to your closest emergency facility if: . You develop worsening high fever. . Trouble breathing . Bluish lips or face . Persistent pain or pressure in the chest . New confusion . Inability to wake or stay awake . You cough up blood. . Your symptoms become more severe  **This list is not all possible symptoms. Contact your medical provider for any symptoms that are sever or concerning to you.  MAKE SURE YOU   Understand these instructions.  Will watch your condition.  Will get  help right away if you are not doing well or get worse.  Your e-visit answers were reviewed by a board certified advanced clinical practitioner to complete your personal care plan.  Depending on the condition, your plan could have included both over the counter or prescription medications.  If there is a problem please reply once you have received a response from your provider.  Your safety is important to [place].  If you have drug allergies check your prescription carefully.    You can use MyChart to ask questions about today's visit, request a non-urgent call back, or ask for a work or school excuse for 24 hours related to this e-Visit. If it has been greater than 24 hours you will need to follow up with your provider, or enter a new e-Visit to address those concerns. You will get an e-mail in the next two days asking about your experience.  I hope that your e-visit has been valuable and will speed your recovery. Thank you for using e-visits.

## 2019-03-17 ENCOUNTER — Encounter (INDEPENDENT_AMBULATORY_CARE_PROVIDER_SITE_OTHER): Payer: Self-pay

## 2019-03-18 ENCOUNTER — Encounter (INDEPENDENT_AMBULATORY_CARE_PROVIDER_SITE_OTHER): Payer: Self-pay

## 2019-03-21 ENCOUNTER — Encounter (INDEPENDENT_AMBULATORY_CARE_PROVIDER_SITE_OTHER): Payer: Self-pay

## 2020-04-02 ENCOUNTER — Other Ambulatory Visit: Payer: Self-pay | Admitting: Orthopedic Surgery

## 2020-04-21 ENCOUNTER — Encounter (HOSPITAL_BASED_OUTPATIENT_CLINIC_OR_DEPARTMENT_OTHER): Payer: Self-pay | Admitting: Orthopedic Surgery

## 2020-04-21 IMAGING — CR DG HAND COMPLETE 3+V*R*
3 series · 3 of 3 positions shown · non-contrast
Comparison: None.

CLINICAL DATA: Acute onset of right hand pain, particularly at the
base of the thumb.

EXAM:
RIGHT HAND - COMPLETE 3+ VIEW

[x hand pa right]
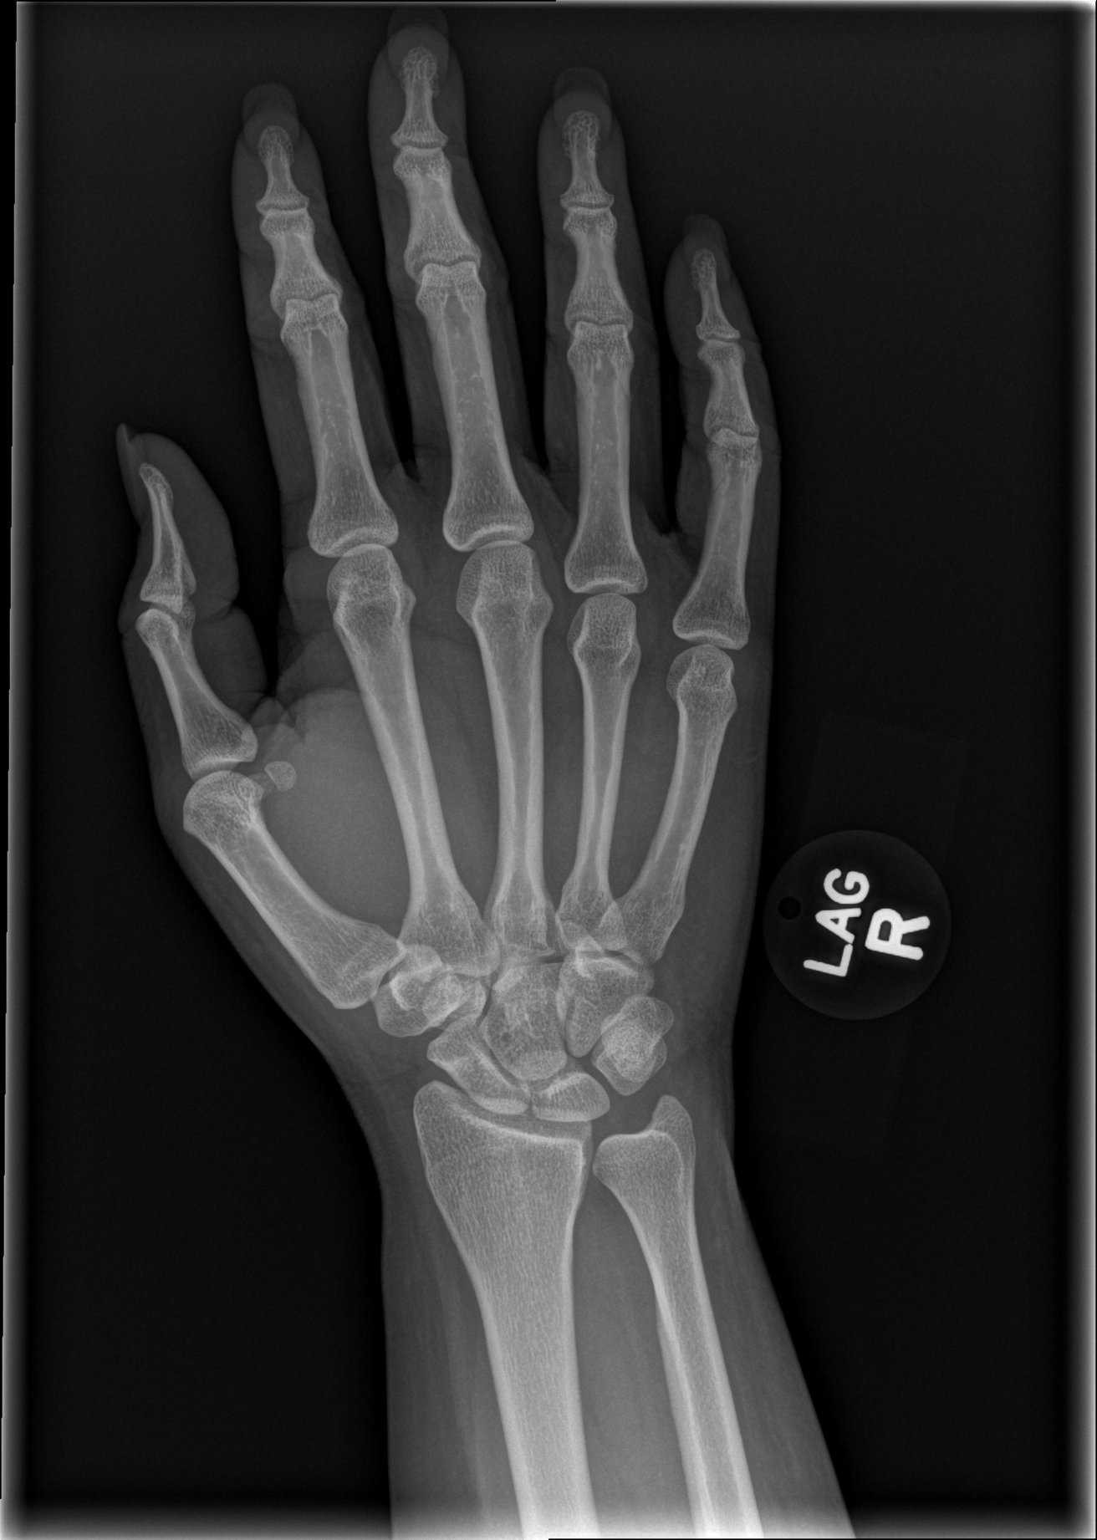

[x hand obl right]
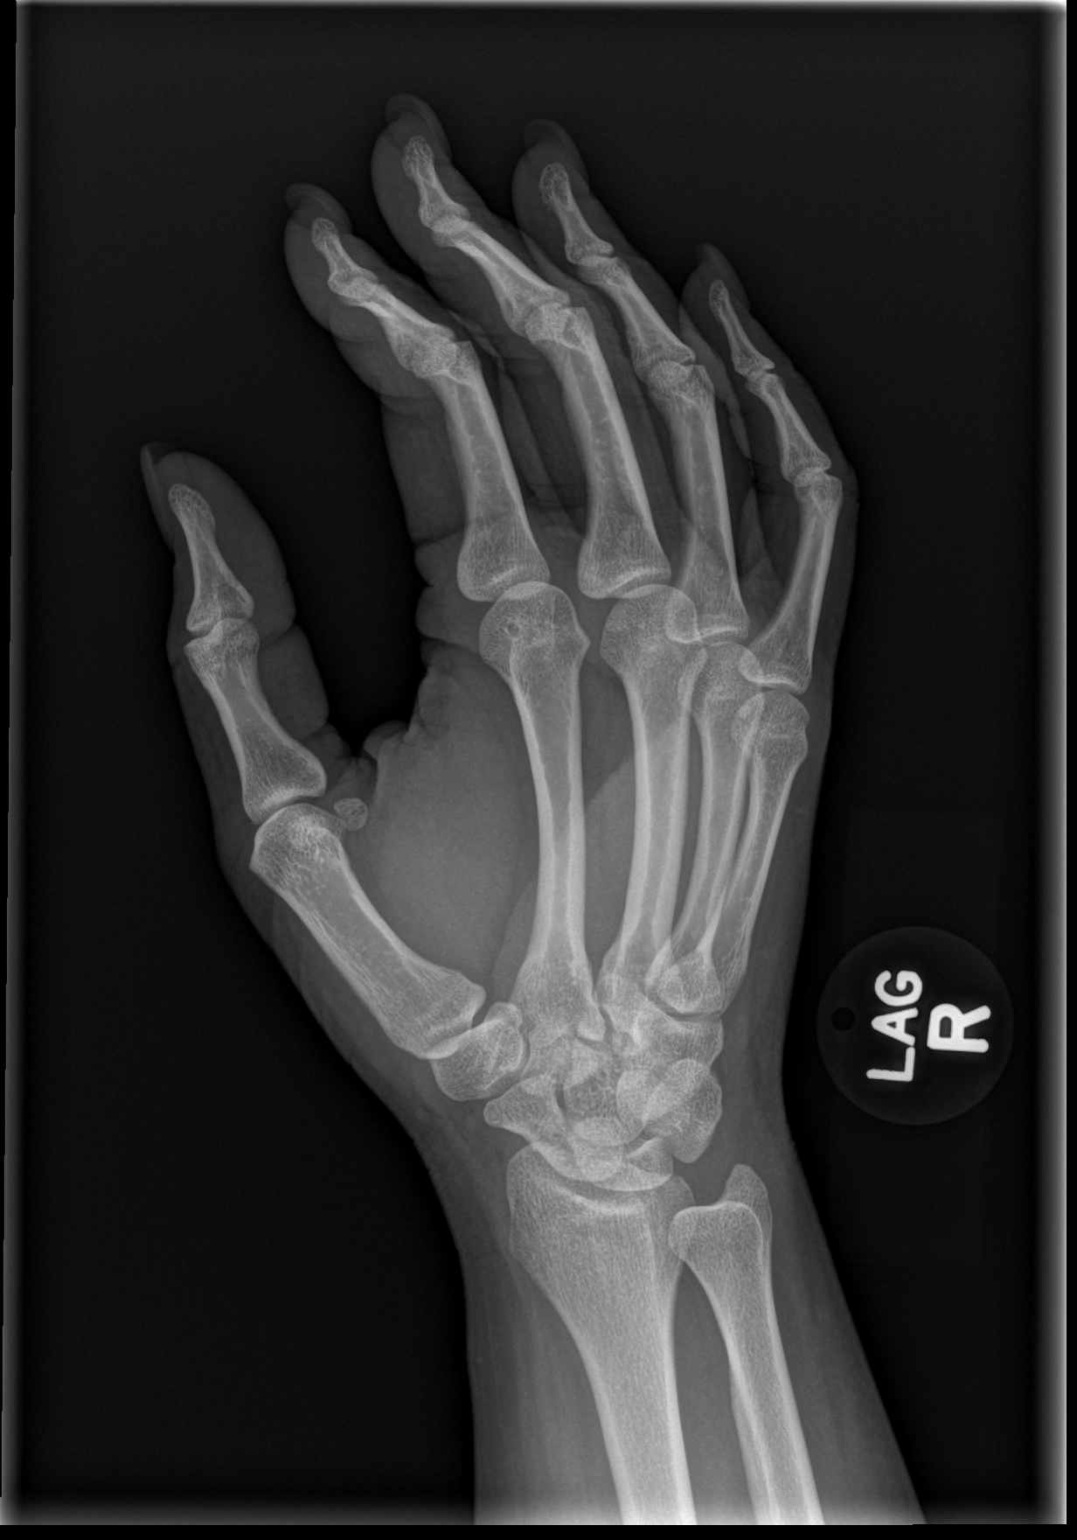

[x hand lat right]
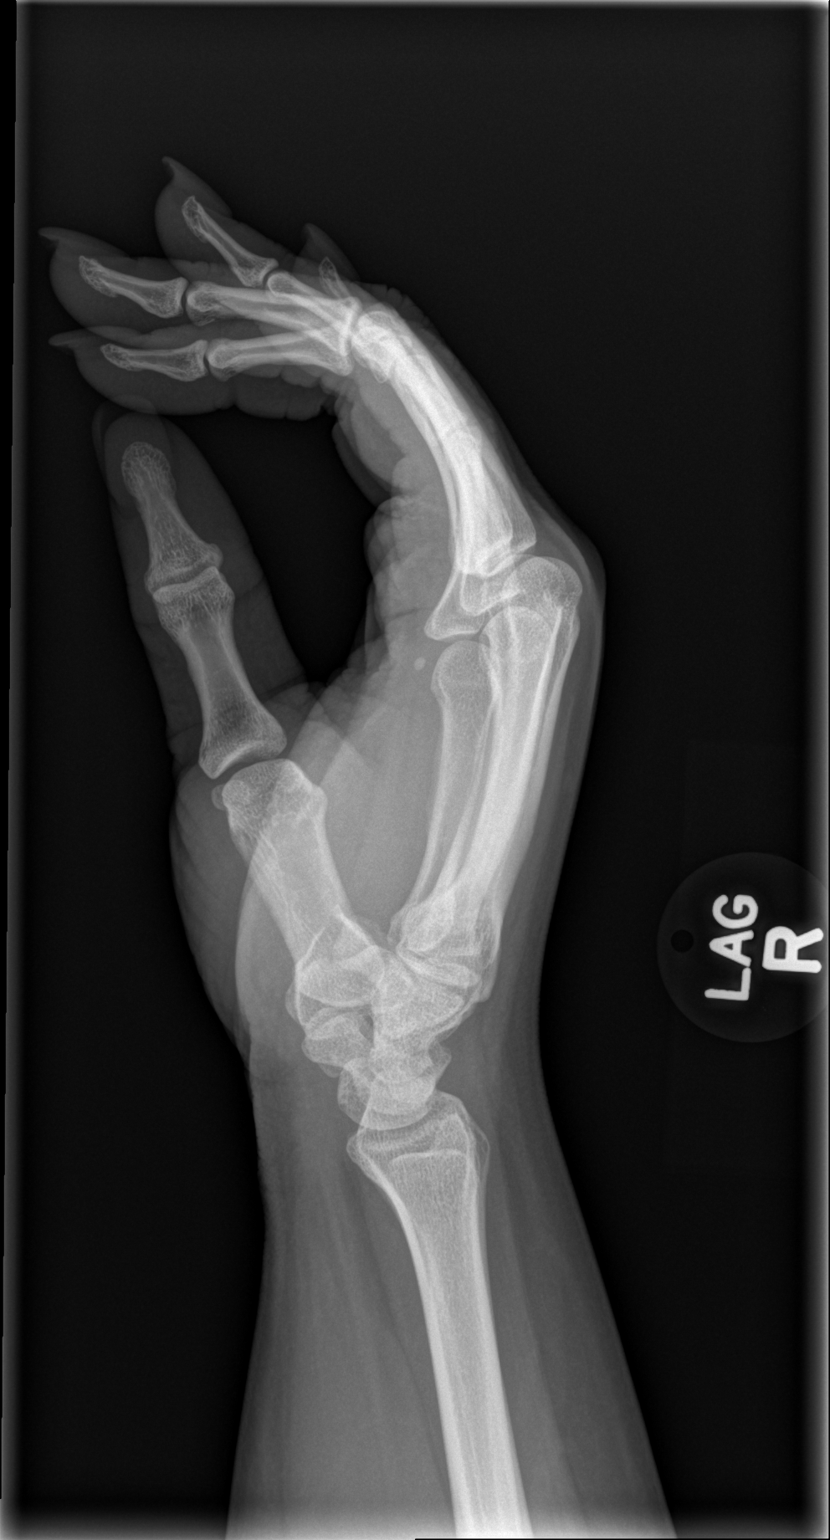

[3 of 3 positions shown; findings below may reference images not displayed]

FINDINGS: There is no evidence of fracture or dislocation. The joint spaces
are preserved. The carpal rows are intact, and demonstrate normal
alignment.

The soft tissues are unremarkable in appearance.
IMPRESSION: No evidence of fracture or dislocation.

## 2020-04-24 ENCOUNTER — Other Ambulatory Visit (HOSPITAL_COMMUNITY)
Admission: RE | Admit: 2020-04-24 | Discharge: 2020-04-24 | Disposition: A | Discharge status: Home / Self Care | Payer: BC Managed Care – PPO | Source: Ambulatory Visit | Attending: Orthopedic Surgery | Admitting: Orthopedic Surgery

## 2020-04-24 DIAGNOSIS — U071 COVID-19: Secondary | ICD-10-CM | POA: Diagnosis not present

## 2020-04-24 DIAGNOSIS — Z01812 Encounter for preprocedural laboratory examination: Secondary | ICD-10-CM | POA: Insufficient documentation

## 2020-04-24 LAB — SARS CORONAVIRUS 2 (TAT 6-24 HRS): SARS Coronavirus 2: POSITIVE — AB

## 2020-04-24 NOTE — Progress Notes (Signed)

## 2020-04-25 NOTE — Progress Notes (Signed)
Patient with positive covid result. Contacted MD and informed of result.   

## 2020-04-27 NOTE — Progress Notes (Signed)
Patient provided documentation of positive covid result from 02/26/20. Preprocedural testing completed on 04/24/20 while patient still in 90 day window resulted positive. Because patient is still in window from earlier covid result, ok to proceed with surgery.

## 2020-04-28 ENCOUNTER — Ambulatory Visit (HOSPITAL_BASED_OUTPATIENT_CLINIC_OR_DEPARTMENT_OTHER): Payer: BC Managed Care – PPO | Admitting: Certified Registered Nurse Anesthetist

## 2020-04-28 ENCOUNTER — Ambulatory Visit (HOSPITAL_BASED_OUTPATIENT_CLINIC_OR_DEPARTMENT_OTHER)
Admission: RE | Admit: 2020-04-28 | Discharge: 2020-04-28 | Disposition: A | Discharge status: Home / Self Care | Payer: BC Managed Care – PPO | Attending: Orthopedic Surgery | Admitting: Orthopedic Surgery

## 2020-04-28 ENCOUNTER — Encounter (HOSPITAL_BASED_OUTPATIENT_CLINIC_OR_DEPARTMENT_OTHER): Payer: Self-pay | Admitting: Orthopedic Surgery

## 2020-04-28 ENCOUNTER — Other Ambulatory Visit: Payer: Self-pay

## 2020-04-28 ENCOUNTER — Encounter (HOSPITAL_BASED_OUTPATIENT_CLINIC_OR_DEPARTMENT_OTHER)
Admission: RE | Disposition: A | Discharge status: Home / Self Care | Payer: Self-pay | Source: Home / Self Care | Attending: Orthopedic Surgery

## 2020-04-28 DIAGNOSIS — Z79899 Other long term (current) drug therapy: Secondary | ICD-10-CM | POA: Diagnosis not present

## 2020-04-28 DIAGNOSIS — G5601 Carpal tunnel syndrome, right upper limb: Secondary | ICD-10-CM | POA: Diagnosis present

## 2020-04-28 HISTORY — PX: CARPAL TUNNEL RELEASE: SHX101

## 2020-04-28 HISTORY — DX: Gastro-esophageal reflux disease without esophagitis: K21.9

## 2020-04-28 LAB — POCT PREGNANCY, URINE: Preg Test, Ur: NEGATIVE

## 2020-04-28 SURGERY — CARPAL TUNNEL RELEASE
Anesthesia: Regional | Site: Wrist | Laterality: Right

## 2020-04-28 MED ORDER — LIDOCAINE HCL (PF) 0.5 % IJ SOLN
INTRAMUSCULAR | Status: DC | PRN
  Administered 2020-04-28: 30 mL via INTRAVENOUS

## 2020-04-28 MED ORDER — OXYCODONE HCL 5 MG PO TABS: 5.0000 mg | ORAL_TABLET | Freq: Once | ORAL | Status: DC | PRN

## 2020-04-28 MED ORDER — LACTATED RINGERS IV SOLN: INTRAVENOUS | Status: DC

## 2020-04-28 MED ORDER — FENTANYL CITRATE (PF) 100 MCG/2ML IJ SOLN: 25.0000 ug | INTRAMUSCULAR | Status: DC | PRN

## 2020-04-28 MED ORDER — MIDAZOLAM HCL 2 MG/2ML IJ SOLN
INTRAMUSCULAR | Status: AC
  Filled 2020-04-28: qty 2

## 2020-04-28 MED ORDER — PROMETHAZINE HCL 25 MG/ML IJ SOLN: 6.2500 mg | INTRAMUSCULAR | Status: DC | PRN

## 2020-04-28 MED ORDER — PROPOFOL 10 MG/ML IV BOLUS
INTRAVENOUS | Status: DC | PRN
  Administered 2020-04-28: 20 mg via INTRAVENOUS

## 2020-04-28 MED ORDER — MEPERIDINE HCL 25 MG/ML IJ SOLN: 6.2500 mg | INTRAMUSCULAR | Status: DC | PRN

## 2020-04-28 MED ORDER — ACETAMINOPHEN 160 MG/5ML PO SOLN: 325.0000 mg | ORAL | Status: DC | PRN

## 2020-04-28 MED ORDER — ONDANSETRON HCL 4 MG/2ML IJ SOLN
INTRAMUSCULAR | Status: AC
  Filled 2020-04-28: qty 2

## 2020-04-28 MED ORDER — ONDANSETRON HCL 4 MG/2ML IJ SOLN: 4.0000 mg | Freq: Once | INTRAMUSCULAR | Status: DC | PRN

## 2020-04-28 MED ORDER — CEFAZOLIN SODIUM-DEXTROSE 2-4 GM/100ML-% IV SOLN
2.0000 g | INTRAVENOUS | Status: AC
  Administered 2020-04-28: 2 g via INTRAVENOUS

## 2020-04-28 MED ORDER — HYDROCODONE-ACETAMINOPHEN 5-325 MG PO TABS: ORAL_TABLET | ORAL | 0 refills | Status: DC

## 2020-04-28 MED ORDER — OXYCODONE HCL 5 MG/5ML PO SOLN: 5.0000 mg | Freq: Once | ORAL | Status: DC | PRN

## 2020-04-28 MED ORDER — FENTANYL CITRATE (PF) 100 MCG/2ML IJ SOLN
INTRAMUSCULAR | Status: DC | PRN
  Administered 2020-04-28: 100 ug via INTRAVENOUS

## 2020-04-28 MED ORDER — BUPIVACAINE HCL (PF) 0.25 % IJ SOLN
INTRAMUSCULAR | Status: DC | PRN
  Administered 2020-04-28: 9 mL

## 2020-04-28 MED ORDER — PROPOFOL 500 MG/50ML IV EMUL
INTRAVENOUS | Status: DC | PRN
  Administered 2020-04-28: 50 ug/kg/min via INTRAVENOUS

## 2020-04-28 MED ORDER — MIDAZOLAM HCL 5 MG/5ML IJ SOLN
INTRAMUSCULAR | Status: DC | PRN
  Administered 2020-04-28: 2 mg via INTRAVENOUS

## 2020-04-28 MED ORDER — CEFAZOLIN SODIUM-DEXTROSE 2-4 GM/100ML-% IV SOLN
INTRAVENOUS | Status: AC
  Filled 2020-04-28: qty 100

## 2020-04-28 MED ORDER — ACETAMINOPHEN 325 MG PO TABS: 325.0000 mg | ORAL_TABLET | ORAL | Status: DC | PRN

## 2020-04-28 MED ORDER — ONDANSETRON HCL 4 MG/2ML IJ SOLN
INTRAMUSCULAR | Status: DC | PRN
  Administered 2020-04-28: 4 mg via INTRAVENOUS

## 2020-04-28 MED ORDER — FENTANYL CITRATE (PF) 100 MCG/2ML IJ SOLN
INTRAMUSCULAR | Status: AC
  Filled 2020-04-28: qty 2

## 2020-04-28 SURGICAL SUPPLY — 36 items
APL PRP STRL LF DISP 70% ISPRP (MISCELLANEOUS) ×1
BLADE SURG 15 STRL LF DISP TIS (BLADE) ×2 IMPLANT
BLADE SURG 15 STRL SS (BLADE) ×4
BNDG CMPR 9X4 STRL LF SNTH (GAUZE/BANDAGES/DRESSINGS)
BNDG ELASTIC 3X5.8 VLCR STR LF (GAUZE/BANDAGES/DRESSINGS) ×2 IMPLANT
BNDG ESMARK 4X9 LF (GAUZE/BANDAGES/DRESSINGS) IMPLANT
BNDG GAUZE ELAST 4 BULKY (GAUZE/BANDAGES/DRESSINGS) ×2 IMPLANT
CHLORAPREP W/TINT 26 (MISCELLANEOUS) ×2 IMPLANT
CORD BIPOLAR FORCEPS 12FT (ELECTRODE) ×2 IMPLANT
COVER BACK TABLE 60X90IN (DRAPES) ×2 IMPLANT
COVER MAYO STAND STRL (DRAPES) ×2 IMPLANT
COVER WAND RF STERILE (DRAPES) IMPLANT
CUFF TOURN SGL QUICK 18X4 (TOURNIQUET CUFF) ×2 IMPLANT
DRAPE EXTREMITY T 121X128X90 (DISPOSABLE) ×2 IMPLANT
DRAPE SURG 17X23 STRL (DRAPES) ×2 IMPLANT
DRSG PAD ABDOMINAL 8X10 ST (GAUZE/BANDAGES/DRESSINGS) ×2 IMPLANT
GAUZE SPONGE 4X4 12PLY STRL (GAUZE/BANDAGES/DRESSINGS) ×2 IMPLANT
GAUZE XEROFORM 1X8 LF (GAUZE/BANDAGES/DRESSINGS) ×2 IMPLANT
GLOVE BIO SURGEON STRL SZ7.5 (GLOVE) ×2 IMPLANT
GLOVE SRG 8 PF TXTR STRL LF DI (GLOVE) ×2 IMPLANT
GLOVE SURG ENC MOIS LTX SZ6.5 (GLOVE) ×4 IMPLANT
GLOVE SURG UNDER POLY LF SZ8 (GLOVE) ×4
GOWN STRL REUS W/ TWL LRG LVL3 (GOWN DISPOSABLE) ×1 IMPLANT
GOWN STRL REUS W/TWL LRG LVL3 (GOWN DISPOSABLE) ×2
GOWN STRL REUS W/TWL XL LVL3 (GOWN DISPOSABLE) ×2 IMPLANT
NEEDLE HYPO 25X1 1.5 SAFETY (NEEDLE) ×2 IMPLANT
NS IRRIG 1000ML POUR BTL (IV SOLUTION) ×2 IMPLANT
PACK BASIN DAY SURGERY FS (CUSTOM PROCEDURE TRAY) ×2 IMPLANT
PADDING CAST ABS 4INX4YD NS (CAST SUPPLIES) ×1
PADDING CAST ABS COTTON 4X4 ST (CAST SUPPLIES) ×1 IMPLANT
STOCKINETTE 4X48 STRL (DRAPES) ×2 IMPLANT
SUT ETHILON 4 0 PS 2 18 (SUTURE) ×2 IMPLANT
SYR BULB EAR ULCER 3OZ GRN STR (SYRINGE) ×2 IMPLANT
SYR CONTROL 10ML LL (SYRINGE) ×2 IMPLANT
TOWEL GREEN STERILE FF (TOWEL DISPOSABLE) ×4 IMPLANT
UNDERPAD 30X36 HEAVY ABSORB (UNDERPADS AND DIAPERS) ×2 IMPLANT

## 2020-04-28 NOTE — Discharge Instructions (Addendum)
Regional Anesthesia Blocks ° °1. Numbness or the inability to move the "blocked" extremity may last from 3-48 hours after placement. The length of time depends on the medication injected and your individual response to the medication. If the numbness is not going away after 48 hours, call your surgeon. ° °2. The extremity that is blocked will need to be protected until the numbness is gone and the  Strength has returned. Because you cannot feel it, you will need to take extra care to avoid injury. Because it may be weak, you may have difficulty moving it or using it. You may not know what position it is in without looking at it while the block is in effect. ° °3. For blocks in the legs and feet, returning to weight bearing and walking needs to be done carefully. You will need to wait until the numbness is entirely gone and the strength has returned. You should be able to move your leg and foot normally before you try and bear weight or walk. You will need someone to be with you when you first try to ensure you do not fall and possibly risk injury. ° °4. Bruising and tenderness at the needle site are common side effects and will resolve in a few days. ° °5. Persistent numbness or new problems with movement should be communicated to the surgeon or the [facility] ([phone])/ [facility] ([phone]). ° ° ° °Post Anesthesia Home Care Instructions ° °Activity: °Get plenty of rest for the remainder of the day. A responsible individual must stay with you for 24 hours following the procedure.  °For the next 24 hours, DO NOT: °-Drive a car °-Operate machinery °-Drink alcoholic beverages °-Take any medication unless instructed by your physician °-Make any legal decisions or sign important papers. ° °Meals: °Start with liquid foods such as gelatin or soup. Progress to regular foods as tolerated. Avoid greasy, spicy, heavy foods. If nausea and/or vomiting occur, drink only clear liquids until the  nausea and/or vomiting subsides. Call your physician if vomiting continues. ° °Special Instructions/Symptoms: °Your throat may feel dry or sore from the anesthesia or the breathing tube placed in your throat during surgery. If this causes discomfort, gargle with warm salt water. The discomfort should disappear within 24 hours. ° °If you had a scopolamine patch placed behind your ear for the management of post- operative nausea and/or vomiting: ° °1. The medication in the patch is effective for 72 hours, after which it should be removed.  Wrap patch in a tissue and discard in the trash. Wash hands thoroughly with soap and water. °2. You may remove the patch earlier than 72 hours if you experience unpleasant side effects which may include dry mouth, dizziness or visual disturbances. °3. Avoid touching the patch. Wash your hands with soap and water after contact with the patch. °  °Hand Center Instructions °Hand Surgery ° °Wound Care: °Keep your hand elevated above the level of your heart.  Do not allow it to dangle by your side.  Keep the dressing dry and do not remove it unless your doctor advises you to do so.  He will usually change it at the time of your post-op visit.  Moving your fingers is advised to stimulate circulation but will depend on the site of your surgery.  If you have a splint applied, your doctor will advise you regarding movement. ° °Activity: °Do not drive or operate machinery today.  Rest today and then you may   return to your normal activity and work as indicated by your physician. ° °Diet:  °Drink liquids today or eat a light diet.  You may resume a regular diet tomorrow.   ° °General expectations: °Pain for two to three days. °Fingers may become slightly swollen. ° °Call your doctor if any of the following occur: °Severe pain not relieved by pain medication. °Elevated temperature. °Dressing soaked with blood. °Inability to move fingers. °White or bluish color to fingers. ° °

## 2020-04-28 NOTE — Anesthesia Preprocedure Evaluation (Addendum)
Anesthesia Evaluation  Patient identified by MRN, date of birth, ID band Patient awake    Reviewed: Allergy & Precautions, H&P , NPO status , Patient's Chart, lab work & pertinent test results, Unable to perform ROS - Chart review only  Airway Mallampati: I  TM Distance: >3 FB Neck ROM: Full    Dental no notable dental hx. (+) Teeth Intact, Dental Advisory Given   Pulmonary neg pulmonary ROS,    Pulmonary exam normal breath sounds clear to auscultation       Cardiovascular negative cardio ROS Normal cardiovascular exam Rhythm:Regular Rate:Normal     Neuro/Psych  Neuromuscular disease negative psych ROS   GI/Hepatic Neg liver ROS, GERD  Medicated and Controlled,  Endo/Other  negative endocrine ROS  Renal/GU negative Renal ROS  negative genitourinary   Musculoskeletal negative musculoskeletal ROS (+)   Abdominal (+) + obese,   Peds negative pediatric ROS (+)  Hematology negative hematology ROS (+)   Anesthesia Other Findings   Reproductive/Obstetrics negative OB ROS                            Anesthesia Physical Anesthesia Plan  ASA: II  Anesthesia Plan: Bier Block and MAC and Bier Block-LIDOCAINE ONLY   Post-op Pain Management:    Induction: Intravenous  PONV Risk Score and Plan: Ondansetron, Propofol infusion, Treatment may vary due to age or medical condition and Midazolam  Airway Management Planned: Natural Airway and Nasal Cannula  Additional Equipment: None  Intra-op Plan:   Post-operative Plan:   Informed Consent:   Plan Discussed with: Anesthesiologist and CRNA  Anesthesia Plan Comments:        Anesthesia Quick Evaluation

## 2020-04-28 NOTE — H&P (Signed)
  Christine Alvarado is an 40 y.o. female.   Chief Complaint: carpal tunnel syndrome HPI: 40 yo female with numbness and tingling right hand.  positive nerve conduction studies.  Nocturnal symptoms.  She wishes to have right carpal tunnel release.  Allergies:  Allergies  Allergen Reactions  . Carbamazepine Nausea Only, Rash and Other (See Comments)    Abnormal LFT's also  . Dilantin [Phenytoin] Other (See Comments)    Abnormal visual changes  . Tape Rash    Past Medical History:  Diagnosis Date  . GERD (gastroesophageal reflux disease)   . Nerve disorder   . Trigeminal nerve disease     Past Surgical History:  Procedure Laterality Date  . BRAIN SURGERY    . LAPAROSCOPIC GASTRIC BAND REMOVAL WITH LAPAROSCOPIC GASTRIC SLEEVE RESECTION  01/08/2019  . OTHER SURGICAL HISTORY     microvascular decompression of left side of brain    Family History: Family History  Problem Relation Age of Onset  . Hypertension Mother   . Hyperlipidemia Mother   . Heart disease Father   . Hyperlipidemia Father   . Stroke Father   . Diabetes Mellitus I Sister     Social History:   reports that she has never smoked. She has never used smokeless tobacco. She reports current alcohol use. She reports that she does not use drugs.  Medications: Medications Prior to Admission  Medication Sig Dispense Refill  . dicyclomine (BENTYL) 10 MG capsule Take 10 mg by mouth 3 (three) times daily.    Marland Kitchen omeprazole (PRILOSEC) 20 MG capsule Take 20 mg by mouth 2 (two) times daily.    . OXcarbazepine (TRILEPTAL) 150 MG tablet 300 mg in the morning, at noon, and at bedtime.    . senna (SENOKOT) 8.6 MG tablet Take 1 tablet by mouth daily.    . ursodiol (ACTIGALL) 250 MG tablet 250 mg 2 (two) times daily.      Results for orders placed or performed during the hospital encounter of 04/28/20 (from the past 48 hour(s))  Pregnancy, urine POC     Status: None   Collection Time: 04/28/20 11:21 AM  Result Value Ref Range    Preg Test, Ur NEGATIVE NEGATIVE    Comment:        THE SENSITIVITY OF THIS METHODOLOGY IS >24 mIU/mL     No results found.   A comprehensive review of systems was negative.  Blood pressure 112/65, pulse 83, temperature 98.2 F (36.8 C), temperature source Oral, resp. rate 18, height 5' 4.5" (1.638 m), weight 93 kg, last menstrual period 04/07/2020, SpO2 100 %.  General appearance: alert, cooperative and appears stated age Head: Normocephalic, without obvious abnormality, atraumatic Neck: supple, symmetrical, trachea midline Cardio: regular rate and rhythm Resp: clear to auscultation bilaterally Extremities: Intact sensation and capillary refill all digits.  +epl/fpl/io.  No wounds.  Pulses: 2+ and symmetric Skin: Skin color, texture, turgor normal. No rashes or lesions Neurologic: Grossly normal Incision/Wound: none  Assessment/Plan Right carpal tunnel syndrome.  Non operative and operative treatment options have been discussed with the patient and patient wishes to proceed with operative treatment. Risks, benefits, and alternatives of surgery have been discussed and the patient agrees with the plan of care.   Leanora Cover 04/28/2020, 12:40 PM

## 2020-04-28 NOTE — Transfer of Care (Signed)
Immediate Anesthesia Transfer of Care Note  Patient: Christine Alvarado  Procedure(s) Performed: CARPAL TUNNEL RELEASE (Right Wrist)  Patient Location: PACU  Anesthesia Type:MAC  Level of Consciousness: drowsy, patient cooperative and responds to stimulation  Airway & Oxygen Therapy: Patient Spontanous Breathing and Patient connected to face mask oxygen  Post-op Assessment: Report given to RN and Post -op Vital signs reviewed and stable  Post vital signs: Reviewed and stable  Last Vitals:  Vitals Value Taken Time  BP 113/65 04/28/20 1351  Temp    Pulse 71 04/28/20 1352  Resp 14 04/28/20 1352  SpO2 100 % 04/28/20 1352  Vitals shown include unvalidated device data.  Last Pain:  Vitals:   04/28/20 1133  TempSrc: Oral  PainSc: 3          Complications: No complications documented.

## 2020-04-28 NOTE — Op Note (Signed)
04/28/2020 [facility]                              OPERATIVE REPORT   PREOPERATIVE DIAGNOSIS:  Right carpal tunnel syndrome.  POSTOPERATIVE DIAGNOSIS:  Right carpal tunnel syndrome.  PROCEDURE:  Right carpal tunnel release.  SURGEON:  Leanora Cover, MD  ASSISTANT:  none.  ANESTHESIA: Bier block with sedation  IV FLUIDS:  Per anesthesia flow sheet.  ESTIMATED BLOOD LOSS:  Minimal.  COMPLICATIONS:  None.  SPECIMENS:  None.  TOURNIQUET TIME:    Total Tourniquet Time Documented: Forearm (Right) - 25 minutes Total: Forearm (Right) - 25 minutes   DISPOSITION:  Stable to PACU.  LOCATION: [facility]  INDICATIONS:  40 yo female with numbness and tingling right hand.  Nocturnal symptoms.  Positive nerve conduction studies.  She wishes to have a carpal tunnel release for management of her symptoms.  Risks, benefits and alternatives of surgery were discussed including the risk of blood loss; infection; damage to nerves, vessels, tendons, ligaments, bone; failure of surgery; need for additional surgery; complications with wound healing; continued pain; recurrence of carpal tunnel syndrome; and damage to motor branch. She voiced understanding of these risks and elected to proceed.   OPERATIVE COURSE:  After being identified preoperatively by myself, the patient and I agreed upon the procedure and site of procedure.  The surgical site was marked.  Surgical consent had been signed.  She was given IV Ancef as preoperative antibiotic prophylaxis.  She was transferred to the operating room and placed on the operating room table in supine position with the Right upper extremity on an armboard.  Bier block anesthesia was induced by the anesthesiologist.  Right upper extremity was prepped and draped in normal sterile orthopaedic fashion.  A surgical pause was performed between the surgeons, anesthesia, and operating room staff, and all were in agreement as to the patient,  procedure, and site of procedure.  Tourniquet at the proximal aspect of the forearm had been inflated for the Bier block  Incision was made over the transverse carpal ligament and carried into the subcutaneous tissues by spreading technique.  Bipolar electrocautery was used to obtain hemostasis.  The palmar fascia was sharply incised.  The hypothenar muscles inserted across the distal aspect of the transverse carpal ligament.  The muscle was carefully released from the transverse carpal ligament.  The transverse carpal ligament was identified and sharply incised.  It was incised distally first.  The flexor tendons were identified.  The flexor tendon to the ring finger was identified and retracted radially.  The transverse carpal ligament was then incised proximally.  Scissors were used to split the distal aspect of the volar antebrachial fascia.  A finger was placed into the wound to ensure complete decompression, which was the case.  The nerve was examined.  It was adherent to the radial leaflet.  The motor branch was identified and was intact.  The wound was copiously irrigated with sterile saline.  It was then closed with 4-0 nylon in a horizontal mattress fashion.  It was injected with 0.25% plain Marcaine to aid in postoperative analgesia.  It was dressed with sterile Xeroform, 4x4s, an ABD, and wrapped with Kerlix and an Ace bandage.  Tourniquet was deflated at 25 minutes.  Fingertips were pink with brisk capillary refill after deflation of the tourniquet.  Operative drapes were broken down.  The patient was awoken from anesthesia  safely.  She was transferred back to stretcher and taken to the PACU in stable condition.  I will see her back in the office in 1 week for postoperative followup.  I will give her a prescription for Norco 5/325 1-2 tabs PO q6 hours prn pain, dispense # 15.    Leanora Cover, MD Electronically signed, 04/28/20

## 2020-04-29 ENCOUNTER — Encounter (HOSPITAL_BASED_OUTPATIENT_CLINIC_OR_DEPARTMENT_OTHER): Payer: Self-pay | Admitting: Orthopedic Surgery

## 2020-04-29 NOTE — Anesthesia Postprocedure Evaluation (Signed)
Anesthesia Post Note  Patient: Christine Alvarado  Procedure(s) Performed: CARPAL TUNNEL RELEASE (Right Wrist)     Patient location during evaluation: PACU Anesthesia Type: Bier Block and MAC Level of consciousness: awake and alert Pain management: pain level controlled Vital Signs Assessment: post-procedure vital signs reviewed and stable Respiratory status: spontaneous breathing, nonlabored ventilation, respiratory function stable and patient connected to nasal cannula oxygen Cardiovascular status: stable and blood pressure returned to baseline Postop Assessment: no apparent nausea or vomiting Anesthetic complications: no   No complications documented.  Last Vitals:  Vitals:   04/28/20 1425 04/28/20 1500  BP: 122/82 111/84  Pulse: 61 71  Resp: 12   Temp:  36.7 C  SpO2: 100% 100%    Last Pain:  Vitals:   04/28/20 1500  TempSrc: Oral                 Manju Kulkarni

## 2020-09-23 ENCOUNTER — Other Ambulatory Visit: Payer: Self-pay | Admitting: Family Medicine

## 2020-09-23 ENCOUNTER — Ambulatory Visit (INDEPENDENT_AMBULATORY_CARE_PROVIDER_SITE_OTHER): Payer: BC Managed Care – PPO | Admitting: Family Medicine

## 2020-09-23 ENCOUNTER — Other Ambulatory Visit (HOSPITAL_COMMUNITY)
Admission: RE | Admit: 2020-09-23 | Discharge: 2020-09-23 | Disposition: A | Discharge status: Home / Self Care | Payer: BC Managed Care – PPO | Source: Ambulatory Visit | Attending: Family Medicine | Admitting: Family Medicine

## 2020-09-23 ENCOUNTER — Other Ambulatory Visit: Payer: Self-pay

## 2020-09-23 VITALS — BP 113/70 | HR 64 | Ht 64.0 in | Wt 213.8 lb

## 2020-09-23 DIAGNOSIS — Z01419 Encounter for gynecological examination (general) (routine) without abnormal findings: Secondary | ICD-10-CM | POA: Insufficient documentation

## 2020-09-23 DIAGNOSIS — Z1231 Encounter for screening mammogram for malignant neoplasm of breast: Secondary | ICD-10-CM

## 2020-09-23 NOTE — Progress Notes (Signed)
GYNECOLOGY ANNUAL PREVENTATIVE CARE ENCOUNTER NOTE  Subjective:   Christine Alvarado is a 40 y.o. No obstetric history on file. female here for a routine annual gynecologic exam.  Current complaints: none.   Denies abnormal vaginal bleeding, discharge, pelvic pain, problems with intercourse or other gynecologic concerns.    Gynecologic History No LMP recorded. Patient is sexually active  Contraception: IUD Last Pap: 2020. Results were: normal Last mammogram: n/a.  Obstetric History OB History  No obstetric history on file.    Past Medical History:  Diagnosis Date   GERD (gastroesophageal reflux disease)    Nerve disorder    Trigeminal nerve disease     Past Surgical History:  Procedure Laterality Date   BRAIN SURGERY     CARPAL TUNNEL RELEASE Right 04/28/2020   Procedure: CARPAL TUNNEL RELEASE;  Surgeon: Leanora Cover, MD;  Location: [facility];  Service: Orthopedics;  Laterality: Right;   LAPAROSCOPIC GASTRIC BAND REMOVAL WITH LAPAROSCOPIC GASTRIC SLEEVE RESECTION  01/08/2019   OTHER SURGICAL HISTORY     microvascular decompression of left side of brain    Current Outpatient Medications on File Prior to Visit  Medication Sig Dispense Refill   dicyclomine (BENTYL) 10 MG capsule Take 10 mg by mouth 3 (three) times daily.     OXcarbazepine (TRILEPTAL) 150 MG tablet 300 mg in the morning, at noon, and at bedtime.     ursodiol (ACTIGALL) 250 MG tablet 250 mg 2 (two) times daily.     HYDROcodone-acetaminophen (NORCO) 5-325 MG tablet 1-2 tabs po q6 hours prn pain (Patient not taking: Reported on 09/23/2020) 15 tablet 0   omeprazole (PRILOSEC) 20 MG capsule Take 20 mg by mouth 2 (two) times daily.     senna (SENOKOT) 8.6 MG tablet Take 1 tablet by mouth daily. (Patient not taking: Reported on 09/23/2020)     No current facility-administered medications on file prior to visit.    Allergies  Allergen Reactions   Carbamazepine Nausea Only, Rash and Other (See  Comments)    Abnormal LFT's also   Dilantin [Phenytoin] Other (See Comments)    Abnormal visual changes   Tape Rash    Social History   Socioeconomic History   Marital status: Single    Spouse name: Not on file   Number of children: Not on file   Years of education: Not on file   Highest education level: Not on file  Occupational History   Not on file  Tobacco Use   Smoking status: Never   Smokeless tobacco: Never  Vaping Use   Vaping Use: Never used  Substance and Sexual Activity   Alcohol use: Yes    Comment: occ   Drug use: No   Sexual activity: Not on file  Other Topics Concern   Not on file  Social History Narrative   Not on file   Social Determinants of Health   Financial Resource Strain: Not on file  Food Insecurity: Food Insecurity Present   Worried About [place] in the Last Year: Sometimes true   Ran Out of Food in the Last Year: Sometimes true  Transportation Needs: No Transportation Needs   Lack of Transportation (Medical): No   Lack of Transportation (Non-Medical): No  Physical Activity: Not on file  Stress: Not on file  Social Connections: Not on file  Intimate Partner Violence: Not on file    Family History  Problem Relation Age of Onset   Hypertension Mother  Hyperlipidemia Mother    Heart disease Father    Hyperlipidemia Father    Stroke Father    Diabetes Mellitus I Sister     The following portions of the patient's history were reviewed and updated as appropriate: allergies, current medications, past family history, past medical history, past social history, past surgical history and problem list.  Review of Systems Pertinent items are noted in HPI.   Objective:  BP 113/70   Pulse 64   Ht 5\' 4"  (1.626 m)   Wt 213 lb 12.8 oz (97 kg)   BMI 36.70 kg/m  Wt Readings from Last 3 Encounters:  09/23/20 213 lb 12.8 oz (97 kg)  04/28/20 205 lb 0.4 oz (93 kg)  02/06/19 245 lb 4.8 oz (111.3 kg)     Chaperone present during  exam  CONSTITUTIONAL: Well-developed, well-nourished female in no acute distress.  HENT:  Normocephalic, atraumatic, External right and left ear normal. Oropharynx is clear and moist EYES: Conjunctivae and EOM are normal. Pupils are equal, round, and reactive to light. No scleral icterus.  NECK: Normal range of motion, supple, no masses.  Normal thyroid.   CARDIOVASCULAR: Normal heart rate noted, regular rhythm RESPIRATORY: Clear to auscultation bilaterally. Effort and breath sounds normal, no problems with respiration noted. BREASTS: declined ABDOMEN: Soft, normal bowel sounds, no distention noted.  No tenderness, rebound or guarding.  PELVIC: Normal appearing external genitalia; normal appearing vaginal mucosa and cervix.  No abnormal discharge noted.  Normal uterine size MUSCULOSKELETAL: Normal range of motion. No tenderness.  No cyanosis, clubbing, or edema.  2+ distal pulses. SKIN: Skin is warm and dry. No rash noted. Not diaphoretic. No erythema. No pallor. NEUROLOGIC: Alert and oriented to person, place, and time. Normal reflexes, muscle tone coordination. No cranial nerve deficit noted. PSYCHIATRIC: Normal mood and affect. Normal behavior. Normal judgment and thought content.  Assessment:  Annual gynecologic examination with pap smear   Plan:  1. Well Woman Exam Will follow up results of pap smear and manage accordingly. Mammogram scheduled STD testing discussed. Patient requested testing   Routine preventative health maintenance measures emphasized. Please refer to After Visit Summary for other counseling recommendations.    Loma Boston, [place] for [employer]

## 2020-09-23 NOTE — Addendum Note (Signed)
Addended by: Georgia Lopes on: 09/23/2020 05:33 PM   Modules accepted: Orders

## 2020-10-01 LAB — CYTOLOGY - PAP
Comment: NEGATIVE
Diagnosis: UNDETERMINED — AB
High risk HPV: NEGATIVE

## 2020-10-02 ENCOUNTER — Encounter: Payer: Self-pay | Admitting: Family Medicine

## 2020-10-02 DIAGNOSIS — R87610 Atypical squamous cells of undetermined significance on cytologic smear of cervix (ASC-US): Secondary | ICD-10-CM | POA: Insufficient documentation

## 2020-10-06 DIAGNOSIS — B379 Candidiasis, unspecified: Secondary | ICD-10-CM

## 2020-10-06 MED ORDER — FLUCONAZOLE 150 MG PO TABS
150.0000 mg | ORAL_TABLET | Freq: Once | ORAL | 0 refills | Status: AC
Start: 2020-10-06 — End: 2020-10-06

## 2020-11-17 ENCOUNTER — Ambulatory Visit
Admission: RE | Admit: 2020-11-17 | Discharge: 2020-11-17 | Disposition: A | Discharge status: Home / Self Care | Payer: BC Managed Care – PPO | Source: Ambulatory Visit | Attending: Family Medicine | Admitting: Family Medicine

## 2020-11-17 ENCOUNTER — Other Ambulatory Visit: Payer: Self-pay

## 2020-11-17 DIAGNOSIS — Z1231 Encounter for screening mammogram for malignant neoplasm of breast: Secondary | ICD-10-CM

## 2020-11-20 ENCOUNTER — Other Ambulatory Visit: Payer: Self-pay | Admitting: Family Medicine

## 2020-11-20 DIAGNOSIS — R928 Other abnormal and inconclusive findings on diagnostic imaging of breast: Secondary | ICD-10-CM

## 2020-12-02 ENCOUNTER — Other Ambulatory Visit: Payer: Self-pay

## 2020-12-02 DIAGNOSIS — B379 Candidiasis, unspecified: Secondary | ICD-10-CM

## 2020-12-02 MED ORDER — FLUCONAZOLE 150 MG PO TABS: 150.0000 mg | ORAL_TABLET | Freq: Once | ORAL | 0 refills | Status: AC

## 2020-12-02 NOTE — Progress Notes (Signed)
Pt reports continued symptoms of yeast infection via MyChart message. Diflucan sent per protocol.   Apolonio Schneiders RN 12/02/20

## 2020-12-07 ENCOUNTER — Ambulatory Visit
Admission: RE | Admit: 2020-12-07 | Discharge: 2020-12-07 | Disposition: A | Discharge status: Home / Self Care | Payer: BC Managed Care – PPO | Source: Ambulatory Visit | Attending: Family Medicine | Admitting: Family Medicine

## 2020-12-07 ENCOUNTER — Other Ambulatory Visit: Payer: Self-pay | Admitting: Family Medicine

## 2020-12-07 ENCOUNTER — Other Ambulatory Visit: Payer: Self-pay

## 2020-12-07 DIAGNOSIS — R928 Other abnormal and inconclusive findings on diagnostic imaging of breast: Secondary | ICD-10-CM

## 2020-12-08 ENCOUNTER — Ambulatory Visit: Payer: BC Managed Care – PPO

## 2020-12-08 ENCOUNTER — Other Ambulatory Visit (HOSPITAL_COMMUNITY)
Admission: RE | Admit: 2020-12-08 | Discharge: 2020-12-08 | Disposition: A | Discharge status: Home / Self Care | Payer: BC Managed Care – PPO | Source: Ambulatory Visit | Attending: Family Medicine | Admitting: Family Medicine

## 2020-12-08 ENCOUNTER — Ambulatory Visit (INDEPENDENT_AMBULATORY_CARE_PROVIDER_SITE_OTHER): Payer: BC Managed Care – PPO

## 2020-12-08 VITALS — BP 117/78 | HR 73 | Wt 207.9 lb

## 2020-12-08 DIAGNOSIS — N898 Other specified noninflammatory disorders of vagina: Secondary | ICD-10-CM

## 2020-12-08 NOTE — Progress Notes (Signed)
Pt here today with c/o vaginal discharge that is yellowish and clumpy.  Pt reports that she was prescribed Diflucan tablet and it ws not effective and was advised by our clinical staff to come in for self swab.  Pt explained how to obtain self swab.  We will call with abnormal results in 24-48 hrs.  I explained to the pt that because she felt that the Diflucan did not work we will wait until her results come back to see if treatment is needed.  Pt verbalized understanding.    Mel Almond, RN  12/08/20

## 2020-12-09 LAB — CERVICOVAGINAL ANCILLARY ONLY
Bacterial Vaginitis (gardnerella): NEGATIVE
Candida Glabrata: NEGATIVE
Candida Vaginitis: NEGATIVE
Chlamydia: NEGATIVE
Comment: NEGATIVE
Comment: NEGATIVE
Comment: NEGATIVE
Comment: NEGATIVE
Comment: NEGATIVE
Comment: NORMAL
Neisseria Gonorrhea: NEGATIVE
Trichomonas: NEGATIVE

## 2020-12-11 NOTE — Progress Notes (Signed)
Chart reviewed for nurse visit. Agree with plan of care.   Clarnce Flock, MD 12/11/20 1:40 PM

## 2021-06-07 ENCOUNTER — Ambulatory Visit
Admission: RE | Admit: 2021-06-07 | Discharge: 2021-06-07 | Disposition: A | Discharge status: Home / Self Care | Payer: BC Managed Care – PPO | Source: Ambulatory Visit | Attending: Family Medicine | Admitting: Family Medicine

## 2021-06-07 DIAGNOSIS — R928 Other abnormal and inconclusive findings on diagnostic imaging of breast: Secondary | ICD-10-CM

## 2021-09-22 ENCOUNTER — Ambulatory Visit: Payer: BC Managed Care – PPO | Admitting: Family Medicine

## 2021-09-24 ENCOUNTER — Other Ambulatory Visit (HOSPITAL_COMMUNITY)
Admission: RE | Admit: 2021-09-24 | Discharge: 2021-09-24 | Disposition: A | Discharge status: Home / Self Care | Payer: BC Managed Care – PPO | Source: Ambulatory Visit | Attending: Family Medicine | Admitting: Family Medicine

## 2021-09-24 ENCOUNTER — Other Ambulatory Visit: Payer: Self-pay

## 2021-09-24 ENCOUNTER — Ambulatory Visit (INDEPENDENT_AMBULATORY_CARE_PROVIDER_SITE_OTHER): Payer: BC Managed Care – PPO | Admitting: Medical

## 2021-09-24 ENCOUNTER — Encounter: Payer: Self-pay | Admitting: Medical

## 2021-09-24 VITALS — BP 111/74 | HR 82 | Ht 64.0 in | Wt 201.2 lb

## 2021-09-24 DIAGNOSIS — R87610 Atypical squamous cells of undetermined significance on cytologic smear of cervix (ASC-US): Secondary | ICD-10-CM

## 2021-09-24 DIAGNOSIS — B9689 Other specified bacterial agents as the cause of diseases classified elsewhere: Secondary | ICD-10-CM

## 2021-09-24 DIAGNOSIS — N898 Other specified noninflammatory disorders of vagina: Secondary | ICD-10-CM

## 2021-09-24 DIAGNOSIS — N760 Acute vaginitis: Secondary | ICD-10-CM | POA: Diagnosis not present

## 2021-09-24 MED ORDER — METRONIDAZOLE 500 MG PO TABS: 500.0000 mg | ORAL_TABLET | Freq: Two times a day (BID) | ORAL | 0 refills | Status: DC

## 2021-09-24 NOTE — Progress Notes (Signed)
History:  Christine Alvarado is a 41 y.o. No obstetric history on file. who presents to clinic today for annual exam with pap smear. Patient had ASCUS pap with negative HPV 09/23/20. Recommendation is for repeat in 1 year. Patient had mammogram 11/19/2020. She has IUD in place for birth control and still has regular periods. She states discharge is yellow with odor x 1 weeks. She denies abnormal bleeding, pain or breast concerns.    The following portions of the patient's history were reviewed and updated as appropriate: allergies, current medications, family history, past medical history, social history, past surgical history and problem list.  Review of Systems:  Review of Systems  Constitutional:  Negative for fever and malaise/fatigue.  Gastrointestinal:  Negative for abdominal pain, constipation, diarrhea, nausea and vomiting.  Genitourinary:  Negative for dysuria, frequency and urgency.       Neg - vaginal bleeding, pelvic pain + vaginal discharge with odor       Objective:  Physical Exam BP 111/74   Pulse 82   Ht '5\' 4"'$  (1.626 m)   Wt 201 lb 3.2 oz (91.3 kg)   LMP 09/13/2021 (Exact Date)   BMI 34.54 kg/m  Physical Exam Vitals and nursing note reviewed. Exam conducted with a chaperone present.  Constitutional:      General: She is not in acute distress.    Appearance: Normal appearance. She is well-developed. She is obese.  HENT:     Head: Normocephalic and atraumatic.  Neck:     Thyroid: No thyromegaly.  Cardiovascular:     Rate and Rhythm: Normal rate and regular rhythm.     Heart sounds: No murmur heard. Pulmonary:     Effort: Pulmonary effort is normal. No respiratory distress.     Breath sounds: Normal breath sounds. No wheezing.  Abdominal:     General: Abdomen is flat. Bowel sounds are normal. There is no distension.     Palpations: Abdomen is soft. There is no mass.     Tenderness: There is no abdominal tenderness. There is no guarding or rebound.   Genitourinary:    General: Normal vulva.     Vagina: Vaginal discharge (small, white, malodorous) present. No erythema, tenderness or bleeding.     Cervix: No cervical motion tenderness, discharge, friability, lesion, erythema or cervical bleeding.     Uterus: Not enlarged and not tender.      Adnexa:        Right: No mass or tenderness.         Left: No mass or tenderness.    Musculoskeletal:     Cervical back: Neck supple.  Skin:    General: Skin is warm and dry.     Findings: No erythema.  Neurological:     Mental Status: She is alert and oriented to person, place, and time.  Psychiatric:        Mood and Affect: Mood normal.     Health Maintenance Due  Topic Date Due   TETANUS/TDAP  Never done   COVID-19 Vaccine (3 - Pfizer series) 08/27/2019    Labs, imaging and previous visits in Epic and Care Everywhere reviewed  Assessment & Plan:  1. ASCUS of cervix with negative high risk HPV - Cytology - PAP( [place])  2. Vaginal discharge - Cervicovaginal ancillary only( [place])  3. BV (bacterial vaginosis) - Clinical picture of BV noted on exam, Rx sent  - metroNIDAZOLE (FLAGYL) 500 MG tablet; Take 1 tablet (500 mg total)  by mouth 2 (two) times daily.  Dispense: 14 tablet; Refill: 0   Luvenia Redden, [place] 09/24/2021 12:05 PM

## 2021-09-27 LAB — CERVICOVAGINAL ANCILLARY ONLY
Bacterial Vaginitis (gardnerella): POSITIVE — AB
Comment: NEGATIVE

## 2021-09-30 LAB — CYTOLOGY - PAP
Chlamydia: NEGATIVE
Comment: NEGATIVE
Comment: NEGATIVE
Comment: NEGATIVE
Comment: NORMAL
Diagnosis: UNDETERMINED — AB
High risk HPV: NEGATIVE
Neisseria Gonorrhea: NEGATIVE
Trichomonas: NEGATIVE

## 2021-10-04 ENCOUNTER — Other Ambulatory Visit: Payer: Self-pay | Admitting: Physician Assistant

## 2021-10-04 DIAGNOSIS — Z1231 Encounter for screening mammogram for malignant neoplasm of breast: Secondary | ICD-10-CM

## 2021-10-18 ENCOUNTER — Other Ambulatory Visit: Payer: Self-pay | Admitting: Lactation Services

## 2021-10-18 MED ORDER — FLUCONAZOLE 150 MG PO TABS: 150.0000 mg | ORAL_TABLET | Freq: Once | ORAL | 0 refills | Status: AC

## 2021-10-18 NOTE — Progress Notes (Signed)
Diflucan RX sent to Pharmacy per standing order for s/s vaginal yeast infection ( thick whit discharge with itching).

## 2021-10-23 ENCOUNTER — Encounter: Admit: 2021-10-23 | Payer: PRIVATE HEALTH INSURANCE

## 2021-10-23 ENCOUNTER — Emergency Department: Admit: 2021-10-23 | Payer: BLUE CROSS/BLUE SHIELD

## 2021-10-23 ENCOUNTER — Inpatient Hospital Stay: Admit: 2021-10-23 | Discharge: 2021-10-23 | Payer: BLUE CROSS/BLUE SHIELD | Attending: Emergency Medicine

## 2021-10-23 DIAGNOSIS — M79652 Pain in left thigh: Secondary | ICD-10-CM

## 2021-10-23 DIAGNOSIS — Z888 Allergy status to other drugs, medicaments and biological substances status: Secondary | ICD-10-CM

## 2021-10-23 DIAGNOSIS — Z91048 Other nonmedicinal substance allergy status: Secondary | ICD-10-CM

## 2021-10-23 DIAGNOSIS — S8011XA Contusion of right lower leg, initial encounter: Principal | ICD-10-CM

## 2021-10-23 LAB — CBC WITH AUTO DIFFERENTIAL
BKR WAM ABSOLUTE IMMATURE GRANULOCYTES.: 0.01 x 1000/ÂµL (ref 0.00–0.30)
BKR WAM ABSOLUTE LYMPHOCYTE COUNT.: 1.07 x 1000/ÂµL (ref 0.60–3.70)
BKR WAM ABSOLUTE NRBC (2 DEC): 0 x 1000/ÂµL (ref 0.00–1.00)
BKR WAM ANALYZER ANC: 2.56 x 1000/ÂµL (ref 2.00–7.60)
BKR WAM BASOPHIL ABSOLUTE COUNT.: 0.01 x 1000/ÂµL (ref 0.00–1.00)
BKR WAM BASOPHILS: 0.2 % (ref 0.0–1.4)
BKR WAM EOSINOPHIL ABSOLUTE COUNT.: 0.08 x 1000/ÂµL (ref 0.00–1.00)
BKR WAM EOSINOPHILS: 2 % (ref 0.0–5.0)
BKR WAM HEMATOCRIT (2 DEC): 36 % (ref 35.00–45.00)
BKR WAM HEMOGLOBIN: 11.9 g/dL (ref 11.7–15.5)
BKR WAM IMMATURE GRANULOCYTES: 0.2 % (ref 0.0–1.0)
BKR WAM LYMPHOCYTES: 26.2 % (ref 17.0–50.0)
BKR WAM MCH (PG): 28.6 pg (ref 27.0–33.0)
BKR WAM MCHC: 33.1 g/dL (ref 31.0–36.0)
BKR WAM MCV: 86.5 fL (ref 80.0–100.0)
BKR WAM MONOCYTE ABSOLUTE COUNT.: 0.36 x 1000/ÂµL (ref 0.00–1.00)
BKR WAM MONOCYTES: 8.8 % (ref 4.0–12.0)
BKR WAM MPV: 10.4 fL (ref 8.0–12.0)
BKR WAM NEUTROPHILS: 62.6 % (ref 39.0–72.0)
BKR WAM NUCLEATED RED BLOOD CELLS: 0 % (ref 0.0–1.0)
BKR WAM PLATELETS: 225 x1000/ÂµL (ref 150–420)
BKR WAM RDW-CV: 13.2 % (ref 11.0–15.0)
BKR WAM RED BLOOD CELL COUNT.: 4.16 M/ÂµL (ref 4.00–6.00)
BKR WAM WHITE BLOOD CELL COUNT: 4.1 x1000/ÂµL (ref 4.0–11.0)

## 2021-10-23 LAB — BASIC METABOLIC PANEL
BKR ANION GAP: 8 (ref 7–17)
BKR BLOOD UREA NITROGEN: 8 mg/dL (ref 6–20)
BKR BUN / CREAT RATIO: 8.4 (ref 8.0–23.0)
BKR CALCIUM: 8.9 mg/dL (ref 8.8–10.2)
BKR CHLORIDE: 108 mmol/L — ABNORMAL HIGH (ref 98–107)
BKR CO2: 25 mmol/L (ref 20–30)
BKR CREATININE: 0.95 mg/dL (ref 0.40–1.30)
BKR EGFR, CREATININE (CKD-EPI 2021): >60 mL/min/{1.73_m2} (ref >=60)
BKR GLUCOSE: 77 mg/dL (ref 70–100)
BKR SODIUM: 141 mmol/L (ref 136–144)

## 2021-10-23 MED ORDER — KETOROLAC 30 MG/ML (1 ML) INJECTION SOLUTION
30 mg/mL (1 mL) | Freq: Once | INTRAVENOUS | Status: CP
Start: 2021-10-23 — End: ?
  Administered 2021-10-23: 21:00:00 30 mL via INTRAVENOUS

## 2021-10-23 MED ORDER — ACETAMINOPHEN 325 MG TABLET
325 mg | Freq: Once | ORAL | Status: CP
Start: 2021-10-23 — End: ?
  Administered 2021-10-23: 15:00:00 325 mg via ORAL

## 2021-10-23 MED ORDER — MORPHINE 4 MG/ML INTRAVENOUS SOLUTION
4 mg/mL | Freq: Once | INTRAVENOUS | Status: CP
Start: 2021-10-23 — End: ?
  Administered 2021-10-23: 17:00:00 4 mL via INTRAVENOUS

## 2021-10-23 NOTE — Discharge Instructions
-  Activity: weight bearing and crutches for support-Ice and elevation of left lower led-clot prevention: aspirin  81mg  bid -Pain control with tylenol and ibuprofen-Follow up with orthopaedic provider in [facility] were evaluated and treated in the Emergency Department at [facility]. Attached are further information and instructions.It is important to immediately return to the emergency department if you have any new symptoms, worsening symptoms, change of symptoms, or if you have any other concerns. Some of the symptoms to return to the ED for include but are not limited to fever higher than 100.4, severe nausea/vomiting, if you are unable to keep yourself hydrated with fluids, have severe abdominal pain, lightheadedness or feel like you are going to pass out, chest pain, difficulty breathing, confusion, visual changes or weakness or numbness in your extremities. Please ensure you call your primary care physician or any other recommended specialty physicians as soon as possible to schedule a follow-up appointment. If you are unable to follow-up adequately, please return to the emergency department.

## 2021-10-23 NOTE — ED Notes
10:56 AM Pt here after partaking in a softball game and getting hurt. Pt stated that she was running and began to see stars after stepping on base. Pt stated that she felt an impending doom feeling in her chest. Pt stated that she passed out for what seemed like a few minutes. Pt also endorsing left leg pain. Pt stated pain was worse after she stepped on base. Pt did not fall, but states pain is still there and sharp. Chief Complaint Patient presents with  Leg Injury   Running to a base playing softball then I saw stars and thought I was gonna die  Unable to bear weight to affected extremity.  Pt localizes pain to left thigh.  No obvious deformity or swelling noted on arrival. No past medical history on file.7:18 PMPt taught to use crutches. Pt able to ambulate with crutches, verbalized and demonstrated teach back to this RN.

## 2021-10-23 NOTE — ED Notes
Case Management Screening and Evaluation  Flowsheet Row Most Recent Value Case Management Screening: Chart review completed. If YES to any question below then proceed to CM Eval/Plan  Is there a change in their cognitive function No Do you anticipate a change in this patient's physicial function that will effect discharge needs? No Has there been a readmission within the last 30 days No Were there services prior to admission ( Examples: Assisted Living, HD, Homecare, Extended Care Facility, Methadone, SNF, Outpatient Infusion Center) No Negative/Positive Screen Negative Screening: Case Management department will follow patient's progress and discuss plan of care with treatment team. Case Manager Attestation  I have reviewed the medical record and completed the above screen. CM staff will follow patient's progress and discuss the plan of care with the Treatment Team. Yes  Unable to speak with patient at this time, per chart review no needs identified; wife to transport when medical ready for dc.  CM will follow with patient's team for any need.Antony Blackbird, BSN, MPH, ACM-RNPer Diem Care Manager Care Management Department [phone] [facility])

## 2021-10-24 NOTE — ED Provider Notes
Chief Complaint Patient presents with ? Leg Injury   Running to a base playing softball then I saw stars and thought I was gonna die  Unable to bear weight to affected extremity.  Pt localizes pain to left thigh.  No obvious deformity or swelling noted on arrival. EMERGENCY MEDICINE RESIDENT NOTEHPI:Pt is a 41 y.o. female w/ PMH of Trigeminal Neuralgia who presents with worsening LT thigh pain after running at softball game. She is been unable to move her left leg due to pain. Per patient, she was running around the base began to see stars but did not fall.  She endorses sitting on side lines afterwards and had near syncopal episode when standing up.  She denies any head strike.  No CP, palpitations, SOB, abd pain, N/V. She endorses being hit by softball in right calf a few days ago.Initial PE Findings:Patient is normotensive, afebrile, heart rate is normal, does not appear to be in respiratory distress, alert and oriented, no gross neurological deficits. Lungs CTAB, no obvious cardiac murmur. Abd soft, NTND, no rebound or guarding. No TTP along LT hip.  TTP along anterior LT femur, no bruising. No TTP along knee. Circular hematoma along medial aspect of right calf.Remaining review of systems and physical exam as detailed below.ED Course and Dispo:  - plan to evaluate labs and imaging to r/o FX, Tendon Rupture, Muscle Sprain/Strain; will give tylenol- after tylenol, pt reporting persistent worsening pain; given Morphine- stable H/H- Xray imaging neg for FX, [place] neg for hematoma- plan to evaluate MRI for occult fracture versus muscle tear, will place patient in ED Obs- spoke with Ortho, will evaluate pt- patient signed out to evening providerThis patient was presented to and discussed with Dr. Mikey Bussing and a treatment plan and disposition were collaboratively agreed upon.Marcy Panning, MD, MBSEmergency Medicine ResidentAvailable through Mobile HeartbeatThis note may have been generated using M*Modal voice dictation software please excuse any typographical errors due to flaws in voice recognition.This encounter occurred within the context of the Covid-19 pandemic. ------------------------------------------------------------------------------------------------------------------HPI/PE:Continuity of Care Reassess patient. Agrees to follow-up outpatient where she is based on [place]. Evaluated by ortho, most likely muscle tear but no need for MRI, can discharge on crutches and weight bearing with leg elevation and pain control.Lolly Mustache, MDEmergency Medicine, PGY-1Available through [facility] acute or life threatening problem was considered during this evaluation  A decision regarding hospitalization was made during this visit  External data reviewed: Labs and Notes (OSH or non-ED)Care limited by SDOH: Ambulatory Care Access.Independent interpretation of: XRay and USDirectly spoke with:  Consultant  Physical ExamED Triage Vitals [10/23/21 1021]BP: 91/63Pulse: 90Pulse from  O2 sat: n/aResp: 18Temp: 97.7 ?F (36.5 ?C)Temp src: OralSpO2: 100 % BP 119/75  - Pulse 67  - Temp 97.8 ?F (36.6 ?C) (Oral)  - Resp 16  - SpO2 100% Physical Exam ProceduresAttestation/Critical CarePatient Reevaluation: Attending Supervised: ResidentI saw and examined the patient. I agree with the findings and plan of care as documented in the resident's note except as noted below. Additional acute and/or chronic problems addressed: 41 yo F here with left thigh pain while playing softball pta. Pain so bad she saw stars. No loc. No numbness, tingling. Denies trauma. No falls. No cp, sob,abd pain, n/v. On exam, ttp left anterior thigh , compartments soft , 2+ DP lower ext. Will obtain xr to eval for fx. [place] to eval for soft tissue pathology. No acute findings on imaging. Exam unchanged. High concern for muscle tear. Will obtain mri. D/w ortho, they will come evaluate  pt and provide guidance on if this is the best study.ADMISSION TO ED OBSERVATIONAdmit to Observation Status: Obs date: 10/23/2021  1:49 PM  Indication for observation: Diagnostic testing (MRI, 6hr Dane etc)In addition to serial exams and monitoring of hemodynamics, other anticipated interventions include: Diagnostic testingRelevant home medications to be ordered by me and patient handoff given per protocol Discharge criteria: MRIOrtho consult Rebecka M Hoffman===================Received patient in sign-out.  Discussed with Orthopedics, recommended against an MRI, more likely rectus tear.  Advised pain control, outpatient follow-up.  Will discharge.Clinical Impressions as of 10/23/21 2055 Pain of left thigh  ED DispositionDischarge Amedeo Gory May, MD07/29/23 1428 Sharmon Revere, MDResident07/29/23 1602 Amedeo Gory May, MD07/29/23 1604 Howell Pringle, MD07/29/23 1742 Lolly Mustache, MDResident07/29/23 1903 Howell Pringle, MD07/29/23 2055

## 2021-10-26 NOTE — Progress Notes (Unsigned)
   I, Peterson Lombard, LAT, ATC acting as a scribe for Lynne Leader, MD.  Subjective:    CC: L thigh pain  HPI: Pt is a 41 y/o female c/o L thigh pain ongoing since 10/23/21. MOI: Pt was playing softball and when she was running to base she felt extreme pain in her L thigh. Pt was [facility] at the [facility] ED following the injury c/o "inability to move her L leg." Pt locates paint o   Dx testing: 10/23/21 L thigh soft tissue MSK [place], L femur XR, and labs  Pertinent review of Systems: ***  Relevant historical information: ***   Objective:   There were no vitals filed for this visit. General: Well Developed, well nourished, and in no acute distress.   MSK: ***  Lab and Radiology Results No results found for this or any previous visit (from the past 72 hour(s)). No results found.    Impression and Recommendations:    Assessment and Plan: 41 y.o. female with ***.  PDMP not reviewed this encounter. No orders of the defined types were placed in this encounter.  No orders of the defined types were placed in this encounter.   Discussed warning signs or symptoms. Please see discharge instructions. Patient expresses understanding.   ***

## 2021-10-27 ENCOUNTER — Encounter: Payer: Self-pay | Admitting: Family Medicine

## 2021-10-27 ENCOUNTER — Ambulatory Visit: Payer: BC Managed Care – PPO | Admitting: Family Medicine

## 2021-10-27 ENCOUNTER — Ambulatory Visit: Payer: Self-pay

## 2021-10-27 ENCOUNTER — Other Ambulatory Visit: Payer: Self-pay

## 2021-10-27 VITALS — BP 112/76 | HR 83 | Ht 64.0 in

## 2021-10-27 DIAGNOSIS — M79652 Pain in left thigh: Secondary | ICD-10-CM

## 2021-10-27 NOTE — Progress Notes (Signed)
New PT order placed per pt's request due to high co-pay at original facility.

## 2021-10-27 NOTE — Patient Instructions (Addendum)
Thank you for coming in today.   I recommend you obtained a compression sleeve to help with your joint problems. There are many options on the market however I recommend obtaining a thigh compression Body Helix compression sleeve.  You can find information (including how to appropriate measure yourself for sizing) can be found at www.Body http://www.lambert.com/.  Many of these products are health savings account (HSA) eligible.   You can use the compression sleeve at any time throughout the day but is most important to use while being active as well as for 2 hours post-activity.   It is appropriate to ice following activity with the compression sleeve in place.   I've referred you to Physical Therapy.  Let us know if you don't hear from them in one week.   Recheck in 1 month.   Work on straight leg raises.   Work on Environmental consultant.   You have a quad muscle strain/tear.

## 2021-10-29 ENCOUNTER — Other Ambulatory Visit: Payer: Self-pay

## 2021-10-29 DIAGNOSIS — B9689 Other specified bacterial agents as the cause of diseases classified elsewhere: Secondary | ICD-10-CM

## 2021-10-29 MED ORDER — METRONIDAZOLE 0.75 % VA GEL
1.0000 | Freq: Every day | VAGINAL | 0 refills | Status: DC
Start: 2021-10-29 — End: 2022-06-17

## 2021-11-02 ENCOUNTER — Encounter: Payer: Self-pay | Admitting: Family Medicine

## 2021-11-04 NOTE — Other
[name] is a 41 y.o. female with a hx of trigeminal neuralgia who presents to the ED for evaluation of left anterior thigh pain. Patient reports that she was playing in a softball game today when she ran to a base. When she stepped off the base, she began to have left thigh pain and pain with ambulation. She did not fall. Xray of L femur negative for fracture. Ultrasound of L thigh without hematoma or significant findings. Orthopaedic surgery consulted for further evaluation of thigh pain. On evaluation, patient can straight leg raise and she is neurointact. She has pain with palpation of left anterior thigh. No other acute injuries. She does note getting hit with a softball to the contralateral calf last week and has some ecchymosis to the RLE, but she has been able to bear weight on the right side without issues. PMHHistory reviewed. No pertinent past medical history.PSHNo past surgical history on file.AllergiesAllergies Allergen Reactions ? Oxcarbazepine Anaphylaxis ? Phenytoin Other (See Comments)   it makes me go blind ? Adhesive Tape-Silicones Rash Outpatient MedicationsNo current outpatient medicationsIllicit Drug UseSocial History Substance and Sexual Activity Drug Use Not on file SmokingTobacco Use: Not on file Living [place] soft[place] game. Ambulatory status pre-injuryIndependentReview of systemsConstitutional: Denies fevers, chillsENT: Denies any changesEyes: Denies any acute visual changesCardiovascular: Denies chest pain, palpitationsRespiratory: Denies difficulty breathing, other respiratory changesGastrointestinal: Denies constipation, diarrheaMusculoskeletal: Besides Chief Complaint, denies other musculoskeletal pain/changesSkin: Denies any skin changesNeurologic: Denies any acute neurologic changesHematologic: Denies any hematologic changesAllergic: Denies any allergic reactions/changes besides up to date allergy listPhysical ExaminationVitals:  10/23/21 1340 BP: 103/68 Pulse: 72 Resp: 18 Temp: 98.5 ?F (36.9 ?C) Constitutional:NAD, Awake, AlertPsychiatric:Normal Mood/AffectNeurologic:Aox4Cardiac:Regular rate on peripheral palpationPulmonary:Breathing comfortable on RAGI:Abdomen SoftGU:DeferredLLE:Skin intactNo notable ecchymosis or swellingTTP of mid-anterior thighHip, knee, tib/fib, ankle are nontenderSensation present grossly tn/sp/dpAble to straight leg raiseCan flex at hip and knee+ehl/fhl/ta/gastrosoleus/peronealstoes warm and well perfused, palpable DP pulse, cap refill <2sImaging / studies: [place] L thigh: No fluid collection to suggest hematoma within the visualized anterior left thigh.XR L femur: No acute osseous injury.Assessment and Plan of Care:  Latasha Jones is a 41 y.o. female with atraumatic L thigh pain after running to a base while playing softball. Xrays negative for fracture. Clinical exam and history suggestive of left rectus femorismuscle strain. This can be managed nonoperatively.-Activity: WBAT LLE. Crutches for support-Can provide soft wrap to LLE -Ice and elevation of LLE-DVT: ASA 81mg  bid -Pain control-F/u with orthopaedic provider in [place] with chief resident. Will discuss with Dr. Lawerance Bach, MDOrthopaedic SurgeryOutpatient Follow-upY[phone]

## 2021-11-22 ENCOUNTER — Ambulatory Visit
Admission: RE | Admit: 2021-11-22 | Discharge: 2021-11-22 | Disposition: A | Discharge status: Home / Self Care | Payer: BC Managed Care – PPO | Source: Ambulatory Visit | Attending: Physician Assistant | Admitting: Physician Assistant

## 2021-11-22 DIAGNOSIS — Z1231 Encounter for screening mammogram for malignant neoplasm of breast: Secondary | ICD-10-CM

## 2021-12-01 ENCOUNTER — Ambulatory Visit: Payer: BC Managed Care – PPO | Admitting: Family Medicine

## 2021-12-01 VITALS — BP 108/74 | HR 82 | Ht 64.0 in | Wt 192.0 lb

## 2021-12-01 DIAGNOSIS — M79652 Pain in left thigh: Secondary | ICD-10-CM | POA: Diagnosis not present

## 2021-12-01 NOTE — Patient Instructions (Signed)
Thank you for coming in today.   Continue PT.   Recheck in 6 weeks.   If not getting better we can do shockwave, injection, or other treatments.

## 2021-12-01 NOTE — Progress Notes (Signed)
   I, Peterson Lombard, LAT, ATC acting as a scribe for Lynne Leader, MD.  Christine Alvarado is a 41 y.o. female who presents to [place] at [facility] today for f/u L thigh pain that she injured on 7/29 playing softball and when she was running to 1st base she felt extreme pain in her L thigh. 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 Pt was last seen by Dr. Georgina Snell on 10/27/21 and was advised to treat w/ a compression sleeve, HEP, and was referred to [facility]. Today, pt reports she is able to move her L leg, but pain cont, but improved.   Dx testing: 10/23/21 L thigh soft tissue MSK [place], L femur XR, and labs  Pertinent review of systems: No fevers or chills  Relevant historical information: History of obesity   Exam:  BP 108/74   Pulse 82   Ht '5\' 4"'$  (1.626 m)   Wt 192 lb (87.1 kg)   SpO2 98%   BMI 32.96 kg/m  General: Well Developed, well nourished, and in no acute distress.   MSK: Left thigh: Normal-appearing Normal motion.  Nontender.  Decreased strength to hip flexion and knee extension with some pain.   Assessment and Plan: 41 y.o. female with left quad tear/strain/hematoma.  Improving with PT and conservative management.  Continue PT and home exercise program.  Continue compression.  Recheck in 6 weeks.  If not improved shockwave therapy aspiration or injection or even nitroglycerin patch protocol could be considered.    Discussed warning signs or symptoms. Please see discharge instructions. Patient expresses understanding.   The above documentation has been reviewed and is accurate and complete Lynne Leader, M.D. Total encounter time 20 minutes including face-to-face time with the patient and, reviewing past medical record, and charting on the date of service.

## 2022-01-03 DIAGNOSIS — B379 Candidiasis, unspecified: Secondary | ICD-10-CM

## 2022-01-03 MED ORDER — FLUCONAZOLE 150 MG PO TABS: 150.0000 mg | ORAL_TABLET | Freq: Once | ORAL | 0 refills | Status: AC

## 2022-01-12 ENCOUNTER — Ambulatory Visit: Payer: BC Managed Care – PPO | Admitting: Family Medicine

## 2022-01-24 ENCOUNTER — Ambulatory Visit: Payer: BC Managed Care – PPO | Admitting: Family Medicine

## 2022-01-24 VITALS — BP 114/78 | HR 72 | Ht 64.0 in | Wt 189.0 lb

## 2022-01-24 DIAGNOSIS — M79652 Pain in left thigh: Secondary | ICD-10-CM

## 2022-01-24 NOTE — Progress Notes (Signed)
   I, Peterson Lombard, LAT, ATC acting as a scribe for Lynne Leader, MD.  Nilda Simmer is a 41 y.o. female who presents to [place] at [facility] today for f/u L thigh pain that she injured on 7/29 playing softball and when she was running to 1st base she felt extreme pain in her L thigh. 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 Pt was last seen by Dr. Georgina Snell on 12/01/2021 and was advised to continue HEP, compression, and PT.  Today, patient reports not much pain in her L thigh, unless she does a lot of standing or walking. Pt has pain when trying to do leg exercises at the gym.   Dx testing: 10/23/21 L thigh soft tissue MSK [place], L femur XR, and labs   Pertinent review of systems: no fever or chills  Relevant historical information: Otherwise healthy   Exam:  BP 114/78   Pulse 72   Ht '5\' 4"'$  (1.626 m)   Wt 189 lb (85.7 kg)   SpO2 97%   BMI 32.44 kg/m  General: Well Developed, well nourished, and in no acute distress.   MSK: Left thigh mildly tender to palpation mid anterior thigh.  Normal motion.  Some weakness to resisted knee extension and hip flexion present.  This also produces pain.     Assessment and Plan: 41 y.o. female with left thigh muscle tear.  Improving with home exercise and PT.  Continue to advance exercise as tolerated.  Recheck in 6 weeks.  If needed next step could be shockwave therapy and potentially aspiration of any remaining seroma if seen on ultrasound.       Discussed warning signs or symptoms. Please see discharge instructions. Patient expresses understanding.   The above documentation has been reviewed and is accurate and complete Lynne Leader, M.D.

## 2022-01-24 NOTE — Patient Instructions (Signed)
Thank you for coming in today.   Keep working on the quad exercises.   I really like the bike also.   Recheck in about 6 weeks.   I can do more if needed.

## 2022-02-23 NOTE — Progress Notes (Deleted)
    Christine Alvarado is a 41 y.o. female who presents to [place] at [facility] today for follow up of left thigh pain. Patient was last seen by Dr. Georgina Snell on 01/24/2022 for this reason stating not much pain in her L thigh, unless she does a lot of standing or walking. Pt has pain when trying to do leg exercises at the gym. today patient states     Dx testing: 10/23/21 L thigh soft tissue MSK [place], L femur XR, and labs   Pertinent review of systems: ***  Relevant historical information: ***   Exam:  There were no vitals taken for this visit. General: Well Developed, well nourished, and in no acute distress.   MSK: ***    Lab and Radiology Results No results found for this or any previous visit (from the past 72 hour(s)). No results found.     Assessment and Plan: 41 y.o. female with ***   PDMP not reviewed this encounter. No orders of the defined types were placed in this encounter.  No orders of the defined types were placed in this encounter.    Discussed warning signs or symptoms. Please see discharge instructions. Patient expresses understanding.   ***

## 2022-02-24 ENCOUNTER — Ambulatory Visit: Payer: BC Managed Care – PPO | Admitting: Family Medicine

## 2022-03-07 ENCOUNTER — Ambulatory Visit: Payer: BC Managed Care – PPO | Admitting: Family Medicine

## 2022-04-26 ENCOUNTER — Emergency Department (HOSPITAL_COMMUNITY): Payer: BC Managed Care – PPO

## 2022-04-26 ENCOUNTER — Emergency Department (HOSPITAL_COMMUNITY)
Admission: EM | Admit: 2022-04-26 | Discharge: 2022-04-26 | Disposition: A | Discharge status: Home / Self Care | Payer: BC Managed Care – PPO | Attending: Emergency Medicine | Admitting: Emergency Medicine

## 2022-04-26 ENCOUNTER — Encounter (HOSPITAL_COMMUNITY): Payer: Self-pay | Admitting: Emergency Medicine

## 2022-04-26 DIAGNOSIS — R531 Weakness: Secondary | ICD-10-CM | POA: Diagnosis not present

## 2022-04-26 DIAGNOSIS — Z8616 Personal history of COVID-19: Secondary | ICD-10-CM | POA: Insufficient documentation

## 2022-04-26 DIAGNOSIS — F444 Conversion disorder with motor symptom or deficit: Secondary | ICD-10-CM

## 2022-04-26 DIAGNOSIS — R55 Syncope and collapse: Secondary | ICD-10-CM

## 2022-04-26 DIAGNOSIS — R4182 Altered mental status, unspecified: Secondary | ICD-10-CM | POA: Insufficient documentation

## 2022-04-26 DIAGNOSIS — Y900 Blood alcohol level of less than 20 mg/100 ml: Secondary | ICD-10-CM | POA: Diagnosis not present

## 2022-04-26 LAB — DIFFERENTIAL
Abs Immature Granulocytes: 0.01 10*3/uL (ref 0.00–0.07)
Basophils Absolute: 0 10*3/uL (ref 0.0–0.1)
Basophils Relative: 0 %
Eosinophils Absolute: 0.1 10*3/uL (ref 0.0–0.5)
Eosinophils Relative: 3 %
Immature Granulocytes: 0 %
Lymphocytes Relative: 52 %
Lymphs Abs: 1.3 10*3/uL (ref 0.7–4.0)
Monocytes Absolute: 0.3 10*3/uL (ref 0.1–1.0)
Monocytes Relative: 10 %
Neutro Abs: 0.9 10*3/uL — ABNORMAL LOW (ref 1.7–7.7)
Neutrophils Relative %: 35 %

## 2022-04-26 LAB — ETHANOL: Alcohol, Ethyl (B): <10 mg/dL (ref <10)

## 2022-04-26 LAB — COMPREHENSIVE METABOLIC PANEL
ALT: 18 U/L (ref 0–44)
AST: 17 U/L (ref 15–41)
Albumin: 3.3 g/dL — ABNORMAL LOW (ref 3.5–5.0)
Alkaline Phosphatase: 60 U/L (ref 38–126)
Anion gap: 5 (ref 5–15)
BUN: 8 mg/dL (ref 6–20)
CO2: 25 mmol/L (ref 22–32)
Calcium: 8.7 mg/dL — ABNORMAL LOW (ref 8.9–10.3)
Chloride: 105 mmol/L (ref 98–111)
Creatinine, Ser: 0.92 mg/dL (ref 0.44–1.00)
GFR, Estimated: >60 mL/min (ref >60)
Glucose, Bld: 89 mg/dL (ref 70–99)
Potassium: 3.9 mmol/L (ref 3.5–5.1)
Sodium: 135 mmol/L (ref 135–145)
Total Bilirubin: 0.4 mg/dL (ref 0.3–1.2)
Total Protein: 6.2 g/dL — ABNORMAL LOW (ref 6.5–8.1)

## 2022-04-26 LAB — CBC
HCT: 39.1 % (ref 36.0–46.0)
Hemoglobin: 13.1 g/dL (ref 12.0–15.0)
MCH: 28.5 pg (ref 26.0–34.0)
MCHC: 33.5 g/dL (ref 30.0–36.0)
MCV: 85.2 fL (ref 80.0–100.0)
Platelets: 216 10*3/uL (ref 150–400)
RBC: 4.59 MIL/uL (ref 3.87–5.11)
RDW: 12.6 % (ref 11.5–15.5)
WBC: 2.6 10*3/uL — ABNORMAL LOW (ref 4.0–10.5)
nRBC: 0 % (ref 0.0–0.2)

## 2022-04-26 LAB — I-STAT CHEM 8, ED
BUN: 9 mg/dL (ref 6–20)
Calcium, Ion: 1.18 mmol/L (ref 1.15–1.40)
Chloride: 102 mmol/L (ref 98–111)
Creatinine, Ser: 0.9 mg/dL (ref 0.44–1.00)
Glucose, Bld: 90 mg/dL (ref 70–99)
HCT: 41 % (ref 36.0–46.0)
Hemoglobin: 13.9 g/dL (ref 12.0–15.0)
Potassium: 4.2 mmol/L (ref 3.5–5.1)
Sodium: 138 mmol/L (ref 135–145)
TCO2: 24 mmol/L (ref 22–32)

## 2022-04-26 LAB — PROTIME-INR
INR: 1.1 (ref 0.8–1.2)
Prothrombin Time: 13.9 seconds (ref 11.4–15.2)

## 2022-04-26 LAB — I-STAT BETA HCG BLOOD, ED (MC, WL, AP ONLY): I-stat hCG, quantitative: <5 m[IU]/mL (ref <5)

## 2022-04-26 LAB — APTT: aPTT: 27 seconds (ref 24–36)

## 2022-04-26 LAB — TROPONIN I (HIGH SENSITIVITY): Troponin I (High Sensitivity): <2 ng/L (ref <18)

## 2022-04-26 LAB — CBG MONITORING, ED: Glucose-Capillary: 92 mg/dL (ref 70–99)

## 2022-04-26 MED ORDER — SODIUM CHLORIDE 0.9% FLUSH: 3.0000 mL | Freq: Once | INTRAVENOUS | Status: DC

## 2022-04-26 MED ORDER — IOHEXOL 350 MG/ML SOLN
100.0000 mL | Freq: Once | INTRAVENOUS | Status: AC | PRN
  Administered 2022-04-26: 100 mL via INTRAVENOUS

## 2022-04-26 MED ORDER — LORAZEPAM 2 MG/ML IJ SOLN: 1.0000 mg | INTRAMUSCULAR | Status: DC | PRN

## 2022-04-26 NOTE — ED Notes (Signed)
Pt ambulated to restroom with RN

## 2022-04-26 NOTE — Discharge Instructions (Signed)
You have been seen today in the Emergency Department (ED)  for syncope (passing out).  Your workup including labs, Xray, MRI of the brain and EKG show reassuring results.  Your symptoms may be due to dehydration, so it is important that you drink plenty of non-alcoholic fluids. It is also important to avoid drugs and alcohol.  Your MRI showed no evidence of stroke.  Your chest x-ray showed no evidence of pneumonia.  Please call your regular doctor as soon as possible to schedule the next available clinic appointment to follow up with him/her regarding your visit to the ED and your symptoms. You should ideally follow up with your primary doctor within one week.  Return to the Emergency Department (ED)  if you have any further syncopal episodes (pass out again) or develop ANY chest pain, pressure, tightness, palpitations, trouble breathing, sudden sweating, or other symptoms that concern you.

## 2022-04-26 NOTE — ED Provider Notes (Signed)
[facility] Provider Note   CSN: [id number] Arrival date & time: 04/26/22  1425     History  No chief complaint on file.   Christine Alvarado is a 42 y.o. female.  She is brought in by EMS as a code stroke activation.  She is complaining of left-sided weakness.  She is a very difficult historian, slow to answer not really participating well on exam.  She has a history of trigeminal neuralgia.  They saw her about a week ago and per the report she had recently had some left-sided facial numbness weakness weakness to left arm and leg.  During their exam she did not have any deficits.  Recent diagnosis of COVID, had some chest pain and cough earlier today and possible syncopal event.  The history is provided by the patient and the EMS personnel.  Cerebrovascular Accident This is a new problem. The problem occurs constantly. The problem has not changed since onset.Pertinent negatives include no chest pain and no abdominal pain. Nothing aggravates the symptoms. Nothing relieves the symptoms. She has tried nothing for the symptoms. The treatment provided no relief.       Home Medications Prior to Admission medications   Medication Sig Start Date End Date Taking? Authorizing Provider  dicyclomine (BENTYL) 10 MG capsule Take 10 mg by mouth 3 (three) times daily. 02/06/19   [provider]  metroNIDAZOLE (FLAGYL) 500 MG tablet Take 1 tablet (500 mg total) by mouth 2 (two) times daily. 09/24/21   Luvenia Redden, PA-C  metroNIDAZOLE (METROGEL VAGINAL) 0.75 % vaginal gel Place 1 Applicatorful vaginally at bedtime. Use for 5 nights. 10/29/21   Caren Macadam, MD  omeprazole (PRILOSEC) 20 MG capsule Take 20 mg by mouth 2 (two) times daily. 12/31/18 09/24/21  [provider]  OXcarbazepine (TRILEPTAL) 150 MG tablet 300 mg in the morning, at noon, and at bedtime. 01/11/19   [provider]  Semaglutide-Weight Management (WEGOVY) 1  MG/0.5ML SOAJ Inject 1 mg into the skin.    [provider]  ursodiol (ACTIGALL) 250 MG tablet 250 mg 2 (two) times daily. Patient not taking: Reported on 09/24/2021 12/31/18   [provider]      Allergies    Carbamazepine, Dilantin [phenytoin], and Tape    Review of Systems   Review of Systems  Unable to perform ROS: Mental status change  Cardiovascular:  Negative for chest pain.  Gastrointestinal:  Negative for abdominal pain.    Physical Exam Updated Vital Signs BP 129/82 (BP Location: Right Arm)   Pulse 77   Temp 98 F (36.7 C) (Oral)   Resp 16   Wt 82.2 kg   SpO2 100%   BMI 31.11 kg/m  Physical Exam Vitals and nursing note reviewed.  Constitutional:      General: She is not in acute distress.    Appearance: Normal appearance. She is well-developed.  HENT:     Head: Normocephalic and atraumatic.  Eyes:     Conjunctiva/sclera: Conjunctivae normal.  Cardiovascular:     Rate and Rhythm: Normal rate and regular rhythm.     Heart sounds: No murmur heard. Pulmonary:     Effort: Pulmonary effort is normal. No respiratory distress.     Breath sounds: Normal breath sounds.  Abdominal:     Palpations: Abdomen is soft.     Tenderness: There is no abdominal tenderness. There is no guarding or rebound.  Musculoskeletal:  General: No swelling.     Cervical back: Neck supple.  Skin:    General: Skin is warm and dry.     Capillary Refill: Capillary refill takes less than 2 seconds.  Neurological:     Comments: She is awake but slow to follow commands.  She is not giving much effort.  No gross weakness.     ED Results / Procedures / Treatments   Labs (all labs ordered are listed, but only abnormal results are displayed) Labs Reviewed  CBC - Abnormal; Notable for the following components:      Result Value   WBC 2.6 (*)    All other components within normal limits  DIFFERENTIAL - Abnormal; Notable for the following components:   Neutro Abs 0.9  (*)    All other components within normal limits  COMPREHENSIVE METABOLIC PANEL - Abnormal; Notable for the following components:   Calcium 8.7 (*)    Total Protein 6.2 (*)    Albumin 3.3 (*)    All other components within normal limits  PROTIME-INR  APTT  ETHANOL  I-STAT CHEM 8, ED  CBG MONITORING, ED  I-STAT BETA HCG BLOOD, ED ([facility], [facility], [facility] ONLY)  TROPONIN I (HIGH SENSITIVITY)  TROPONIN I (HIGH SENSITIVITY)    EKG EKG Interpretation  Date/Time:  Tuesday April 26 2022 14:59:34 EST Ventricular Rate:  77 PR Interval:  128 QRS Duration: 116 QT Interval:  392 QTC Calculation: 444 R Axis:   85 Text Interpretation: Sinus rhythm Nonspecific intraventricular conduction delay No significant change since last tracing Confirmed by Georgina Snell [phone]) on 04/26/2022 3:04:19 PM  Radiology DG Chest 2 View  Result Date: 04/26/2022 CLINICAL DATA:  Shortness of breath.  COVID positive. EXAM: CHEST - 2 VIEW COMPARISON:  None Available. FINDINGS: The heart size and mediastinal contours are within normal limits. Both lungs are clear. The visualized skeletal structures are unremarkable. IMPRESSION: No active cardiopulmonary disease. Electronically Signed   By: Marijo Conception M.D.   On: 04/26/2022 16:29   MR BRAIN WO CONTRAST  Result Date: 04/26/2022 CLINICAL DATA:  left sided weakness, hemianopsia EXAM: MRI HEAD WITHOUT CONTRAST TECHNIQUE: Multiplanar, multiecho pulse sequences of the brain and surrounding structures were obtained without intravenous contrast. COMPARISON:  MRI head September 14, 2016. CT head April 26, 2022. FINDINGS: Brain: No acute infarction, hemorrhage, hydrocephalus, extra-axial collection or mass lesion. Possible postoperative changes in the posterior fossa on the left. Vascular: Major arterial flow voids are maintained skull base. Skull and upper cervical spine: Normal marrow signal. Sinuses/Orbits: Clear sinuses.  No acute orbital findings. Other: No mastoid effusions.  IMPRESSION: No evidence of acute intracranial abnormality. Electronically Signed   By: Margaretha Sheffield M.D.   On: 04/26/2022 16:21   CT ANGIO HEAD NECK W WO CM W PERF (CODE STROKE)  Result Date: 04/26/2022 CLINICAL DATA:  Neuro deficit, stroke suspected. EXAM: CT ANGIOGRAPHY HEAD AND NECK CT PERFUSION BRAIN TECHNIQUE: Multidetector CT imaging of the head and neck was performed using the standard protocol during bolus administration of intravenous contrast. Multiplanar CT image reconstructions and MIPs were obtained to evaluate the vascular anatomy. Carotid stenosis measurements (when applicable) are obtained utilizing NASCET criteria, using the distal internal carotid diameter as the denominator. Multiphase CT imaging of the brain was performed following IV bolus contrast injection. Subsequent parametric perfusion maps were calculated using RAPID software. RADIATION DOSE REDUCTION: This exam was performed according to the departmental dose-optimization program which includes automated exposure control, adjustment of the mA and/or kV  according to patient size and/or use of iterative reconstruction technique. CONTRAST:  13m OMNIPAQUE IOHEXOL 350 MG/ML SOLN COMPARISON:  Same-day noncontrast CT head, brain MRI 09/14/2017 FINDINGS: CTA NECK FINDINGS Aortic arch: The imaged aortic arch is normal. The origins of the major branch vessels are patent. The subclavian arteries are patent to the level imaged. Right carotid system: The right common, internal, and external carotid arteries are patent, without hemodynamically significant stenosis or occlusion. There is no dissection or aneurysm. Left carotid system: The left common, internal, and external carotid arteries are patent, without hemodynamically significant stenosis or occlusion. There is no dissection or aneurysm. Vertebral arteries: The vertebral arteries are patent, without hemodynamically significant stenosis or occlusion. There is no dissection or aneurysm.  Skeleton: There is no acute osseous abnormality or suspicious osseous lesion. There is no visible canal hematoma. Other neck: The thyroid is enlarged and heterogeneous. Upper chest: The imaged lung apices are clear. Review of the MIP images confirms the above findings CTA HEAD FINDINGS Anterior circulation: The intracranial ICAs are patent, without stenosis or occlusion. The bilateral MCAs are patent, without proximal stenosis or occlusion. The bilateral ACAs are patent, without proximal stenosis or occlusion. The anterior communicating artery is not definitely seen. There is no aneurysm or AVM. Posterior circulation: The bilateral V4 segments are patent. The basilar artery is patent. The major cerebellar arteries appear patent. The bilateral PCAs are patent, without proximal stenosis or occlusion. Posterior communicating arteries are not definitely seen. There is no aneurysm or AVM. Venous sinuses: Patent. Anatomic variants: None. Review of the MIP images confirms the above findings CT Brain Perfusion Findings: ASPECTS: 10 CBF (<30%) Volume: 057mPerfusion (Tmax>6.0s) volume: 74m55mismatch Volume: 74mL12mfarction Location:N/a IMPRESSION: 1. Normal CTA of the head and neck with no significant stenosis or occlusion. 2. No infarct core or penumbra identified on CT perfusion. 3. Enlarged and heterogeneous thyroid gland. Recommend nonemergent thyroid ultrasound, if not already performed. Electronically Signed   By: PeteValetta Mole.   On: 04/26/2022 15:05   CT HEAD CODE STROKE WO CONTRAST`  Result Date: 04/26/2022 CLINICAL DATA:  Code stroke.  Neuro deficit, acute, stroke suspected EXAM: CT HEAD WITHOUT CONTRAST TECHNIQUE: Contiguous axial images were obtained from the base of the skull through the vertex without intravenous contrast. RADIATION DOSE REDUCTION: This exam was performed according to the departmental dose-optimization program which includes automated exposure control, adjustment of the mA and/or kV  according to patient size and/or use of iterative reconstruction technique. COMPARISON:  CT head September 14, 2017. FINDINGS: Brain: No evidence of acute large vascular territory infarction, hemorrhage, hydrocephalus, extra-axial collection or mass lesion/mass effect. Vascular: No hyperdense vessel identified. Skull: No acute fracture. Sinuses/Orbits: Clear sinuses.  No acute orbital findings. Other: No mastoid effusions. ASPECTS ([facility] Early CT Score) total score (0-10 with 10 being normal): 10. IMPRESSION: 1. No evidence of acute intracranial abnormality. 2. ASPECTS is 10. Code stroke imaging results were communicated on 04/26/2022 at 2:48 pm to [facility] secure text paging. Electronically Signed   By: FredMargaretha Sheffield.   On: 04/26/2022 14:48    Procedures Procedures    Medications Ordered in ED Medications  sodium chloride flush (NS) 0.9 % injection 3 mL (3 mLs Intravenous Not Given 04/26/22 1622)  LORazepam (ATIVAN) injection 1 mg (has no administration in time range)  iohexol (OMNIPAQUE) 350 MG/ML injection 100 mL (100 mLs Intravenous Contrast Given 04/26/22 1453)    ED Course/ Medical Decision Making/ A&P Clinical Course  as of 04/26/22 1657  Tue Apr 26, 2022  1502 Workup so far by neurology, they do not feel this is an acute neurologic event.  They would like an MRI brain for completeness. [MB]  71 Received signout from previous provider 42 year old with obesity and GERD called code stroke for reported unilateral weakness and hemianopsia however per neurology Dr Lorrin Goodell, not c/w stroke pathology. CTH negative. Follow  up CTA head/necka nd MRI. Reportedly recent home COVID positive, reporting CP and SOB. Not hypoxic. EKG unremarkable. Follow up troponin/CXR. PERC negative. [VB]  3419 CTA negative. [VB]    Clinical Course User Index [MB] Hayden Rasmussen, MD [VB] Elgie Congo, MD                             Medical Decision Making Amount and/or  Complexity of Data Reviewed Labs: ordered. Radiology: ordered.   This patient complains of change in mental status syncope possible left-sided weakness visual disturbance; this involves an extensive number of treatment Options and is a complaint that carries with it a high risk of complications and morbidity. The differential includes stroke, bleed, tumor, hypoglycemia, intoxication, psychosomatic, metabolic derangement  I ordered, reviewed and interpreted labs, which included CBC with low white count normal hemoglobin, chemistries fairly unremarkable, LFTs unremarkable, alcohol negative I ordered imaging studies which included CT head CT angio head and neck and I independently    visualized and interpreted imaging which showed no acute findings Additional history obtained from EMS Previous records obtained and reviewed in epic including prior neurosurgery notes I consulted neurology Dr. Lorrin Goodell and discussed lab and imaging findings and discussed disposition.  Cardiac monitoring reviewed, normal sinus rhythm Social determinants considered, positive food insecurity Critical Interventions: None  After the interventions stated above, I reevaluated the patient and found patient to be hemodynamically stable in no distress Admission and further testing considered, her care is signed out to Dr. Nechama Guard to follow-up results of further imaging.  Possible discharge if improvement in findings and no acute stroke on MRI.         Final Clinical Impression(s) / ED Diagnoses Final diagnoses:  Altered mental status, unspecified altered mental status type    Rx / DC Orders ED Discharge Orders     None         Hayden Rasmussen, MD 04/26/22 1701

## 2022-04-26 NOTE — ED Triage Notes (Signed)
Pt arrives via EMS from home as code stroke with LSN 1300. Daughter stepped out to walk the dog and came back to find pt on the floor at 1345. Pt tested covid + on Friday. EMS reports left sided weakness, facial pain and aphasia. Pt alert and talking, reports blurred vision in left eye. Hx of trigeminal nerve disease.

## 2022-04-26 NOTE — ED Provider Notes (Signed)
Clinical Course as of 04/26/22 1757  Tue Apr 26, 2022  1502 Workup so far by neurology, they do not feel this is an acute neurologic event.  They would like an MRI brain for completeness. [MB]  81 Received signout from previous provider 42 year old with obesity and GERD called code stroke for reported unilateral weakness and hemianopsia however per neurology Dr Lorrin Goodell, not c/w stroke pathology. CTH negative. Follow  up CTA head/necka nd MRI. Reportedly recent home COVID positive, reporting CP and SOB. Not hypoxic. EKG unremarkable. Follow up troponin/CXR. PERC negative. [VB]  0459 CTA negative. [VB]  9774 MRI negative for stroke.  Chest x-ray without any acute findings.  Troponin undetected.  Patient without any acute complaints she is hemodynamically stable and well-appearing.  She is without chest pain or shortness of breath.  She is not hypoxic.  She was ambulated to the bathroom without difficulties.  Discharging patient with strict return precautions and advised follow-up with PCP and supportive care.  She is cleared by neurology with negative MRI. [VB]    Clinical Course User Index [MB] Hayden Rasmussen, MD [VB] Christine Congo, MD      Christine Congo, MD 04/26/22 Carollee Massed

## 2022-04-26 NOTE — ED Notes (Signed)
Patient transported to MRI 

## 2022-04-26 NOTE — ED Notes (Signed)
Lab adding on trop at this time

## 2022-04-26 NOTE — ED Notes (Signed)
Pt verbalized understanding of discharge paperwork and follow-up care. Pt discharged with mother.

## 2022-04-26 NOTE — Code Documentation (Signed)
Stroke Response Nurse Documentation Code Documentation  Christine Alvarado is a 42 y.o. female arriving to [facility]  via [facility] on 04-26-2022 with past medical hx of rigeminal neuralgia . On No antithrombotic. Code stroke was activated by EMS.   Patient from Home where she was LKW at 1300 and now complaining of Left side weakness.  She was at home baking a cake when she felt lightheaded and fell to the floor.  She stated that she had difficulty moving her left side.  Her daughter came back home after walking the dog and found her mom on the floor about 1345  Stroke team at the bedside on patient arrival. Labs drawn and patient cleared for CT by Dr. Melina Copa. Patient to CT with team. NIHSS 3, see documentation for details and code stroke times. Patient with decreased LOC, left hemianopia, and left decreased sensation on exam. The following imaging was completed:  CT Head, CTA, and MRI. Patient is not a candidate for IV Thrombolytic due to mild and improving symptoms, inconsistent symptoms. Patient is not a candidate for IR due to no LVO on CTA.   Care Plan: NIHSS and VS q 2 hours x 12 hours.   Bedside handoff with ED RN Burman Nieves.    Raliegh Ip  Stroke Response RN

## 2022-04-26 NOTE — Consult Note (Addendum)
NEUROLOGY CONSULTATION NOTE   Date of service: April 26, 2022 Patient Name: Christine Alvarado MRN:  [id number] DOB:  Dec 13, 1980 Reason for consult: "left sided weakness" Requesting Provider: Elgie Congo, MD _ _ _   _ __   _ __ _ _  __ __   _ __   __ _  History of Present Illness  Christine Alvarado is a 42 y.o. female with PMH significant for GERD, trigeminal neuralgia who presents with 3 to 4-day history of cough and some chest pain and tested positive for COVID-19 at home.  She reports feeling lightheaded and her daughter last saw her at 1 PM.  Daughter left to walk her dog and then returned home at 33 found her passed out on the floor.  Patient was responsive, she was reporting having paralysis of her left face which is not unusual for her in the setting of her trigeminal neuralgia.  Patient was slow and so EMS was called.  EMS noted left-sided weakness in her left upper extremity and left lower extremity and brought her in as a code stroke.  On arrival, patient appears overwhelmed.  She is bradyphrenic and slow to respond.  Her eyes are partially open.  She reports that she has a headache, the left side of her face is hurting but this pain seems a little bit different than her trigeminal neuralgia.  She also reports weakness on her left upper extremity and left lower extremity.   On chart review, it appears that she was seen by her neurosurgery team at [facility] on 04/19/2022.  At that time, she reported that she was feeling left-sided numbness and weakness about 2 weeks ago.  Encouraged her to go to the ER but patient declined.  She tells me today that her episode had resolved.   LKW: 1300 on 04/26/22. mRS: 0 tNKASE: Not offered as symptoms felt more consistent with functional neurological deficit rather than true stroke.  Her left-sided weakness was distractible and improved significantly with encouragement.  She reports having left hemianopsia but when I test her vision individually  in each eye, she has no hemianopsia when testing her right eye and then reports left hemianopsia when testing her left eye.  This would be very odd and there would not be a single lesion in the brain that would present this way.  Thrombectomy: CT angio and CT perfusion were obtained which were both negative for large vessel occlusion. NIHSS components Score: Comment  1a Level of Conscious 0'[]'$  1'[x]'$  2'[]'$  3'[]'$      1b LOC Questions 0'[x]'$  1'[]'$  2'[]'$       1c LOC Commands 0'[x]'$  1'[]'$  2'[]'$       2 Best Gaze 0'[x]'$  1'[]'$  2'[]'$       3 Visual 0'[]'$  1'[x]'$  2'[]'$  3'[]'$      4 Facial Palsy 0'[x]'$  1'[]'$  2'[]'$  3'[]'$      5a Motor Arm - left 0'[]'$  1'[x]'$  2'[]'$  3'[]'$  4'[]'$  UN'[]'$    5b Motor Arm - Right 0'[x]'$  1'[]'$  2'[]'$  3'[]'$  4'[]'$  UN'[]'$    6a Motor Leg - Left 0'[x]'$  1'[]'$  2'[]'$  3'[]'$  4'[]'$  UN'[]'$    6b Motor Leg - Right 0'[x]'$  1'[]'$  2'[]'$  3'[]'$  4'[]'$  UN'[]'$    7 Limb Ataxia 0'[x]'$  1'[]'$  2'[]'$  3'[]'$  UN'[]'$     8 Sensory 0'[x]'$  1'[]'$  2'[]'$  UN'[]'$      9 Best Language 0'[x]'$  1'[]'$  2'[]'$  3'[]'$      10 Dysarthria 0'[x]'$  1'[]'$  2'[]'$  UN'[]'$      11 Extinct. and Inattention 03 1'[]'$  2'[]'$       TOTAL:  ROS   Constitutional Denies weight loss, fever and chills.   HEENT + changes in vision with intact hearing.   Respiratory Denies SOB and cough.   CV Denies palpitations and CP   GI Denies abdominal pain, nausea, vomiting and diarrhea.   GU Denies dysuria and urinary frequency.   MSK Denies myalgia and joint pain.   Skin Denies rash and pruritus.   Neurological + headache and syncope.   Psychiatric Denies recent changes in mood. Denies anxiety and depression.    Past History   Past Medical History:  Diagnosis Date   GERD (gastroesophageal reflux disease)    Nerve disorder    Trigeminal nerve disease    Past Surgical History:  Procedure Laterality Date   BRAIN SURGERY     CARPAL TUNNEL RELEASE Right 04/28/2020   Procedure: CARPAL TUNNEL RELEASE;  Surgeon: Leanora Cover, MD;  Location: [facility];  Service: Orthopedics;  Laterality: Right;   LAPAROSCOPIC GASTRIC BAND REMOVAL WITH LAPAROSCOPIC GASTRIC  SLEEVE RESECTION  01/08/2019   OTHER SURGICAL HISTORY     microvascular decompression of left side of brain   Family History  Problem Relation Age of Onset   Breast cancer Mother 63   Hypertension Mother    Hyperlipidemia Mother    Heart disease Father    Hyperlipidemia Father    Stroke Father    Diabetes Mellitus I Sister    Breast cancer Maternal Aunt    Social History   Socioeconomic History   Marital status: Single    Spouse name: Not on file   Number of children: Not on file   Years of education: Not on file   Highest education level: Not on file  Occupational History   Not on file  Tobacco Use   Smoking status: Never   Smokeless tobacco: Never  Vaping Use   Vaping Use: Never used  Substance and Sexual Activity   Alcohol use: Yes    Comment: occ   Drug use: No   Sexual activity: Not on file  Other Topics Concern   Not on file  Social History Narrative   Not on file   Social Determinants of Health   Financial Resource Strain: Not on file  Food Insecurity: Food Insecurity Present (09/23/2020)   Hunger Vital Sign    Worried About Running Out of Food in the Last Year: Sometimes true    Ran Out of Food in the Last Year: Sometimes true  Transportation Needs: No Transportation Needs (09/23/2020)   PRAPARE - Hydrologist (Medical): No    Lack of Transportation (Non-Medical): No  Physical Activity: Not on file  Stress: Not on file  Social Connections: Not on file   Allergies  Allergen Reactions   Carbamazepine Nausea Only, Rash and Other (See Comments)    Abnormal LFT's also   Dilantin [Phenytoin] Other (See Comments)    Abnormal visual changes   Tape Rash    Medications  (Not in a hospital admission)    Vitals   Vitals:   04/26/22 1400 04/26/22 1500 04/26/22 1503 04/26/22 1530  BP:  129/82  129/85  Pulse:  77  73  Resp:  16  13  Temp:  98 F (36.7 C)    TempSrc:  Oral    SpO2:  100%  100%  Weight: 82.2 kg  82.2 kg    Height:   '5\' 4"'$  (1.626 m)      Body mass index is 31.11 kg/m.  Physical Exam   General: Laying comfortably in bed; in no acute distress.  HENT: Normal oropharynx and mucosa. Normal external appearance of ears and nose.  Neck: Supple, no pain or tenderness  CV: No JVD. No peripheral edema.  Pulmonary: Symmetric Chest rise. Normal respiratory effort.  Abdomen: Soft to touch, non-tender.  Ext: No cyanosis, edema, or deformity  Skin: No rash. Normal palpation of skin.   Musculoskeletal: Normal digits and nails by inspection. No clubbing.   Neurologic Examination  Mental status/Cognition: Alert, oriented to self, place, month and year, good attention.  Speech/language: non fluent, bradyphrenic, comprehension intact, object naming intact, repetition intact.  Cranial nerves:   CN II Pupils equal and reactive to light, left monocular left hemianopsia?   CN III,IV,VI EOM intact, no gaze preference or deviation, no nystagmus    CN V normal sensation in V1, V2, and V3 segments bilaterally    CN VII no asymmetry, no nasolabial fold flattening    CN VIII normal hearing to speech    CN IX & X normal palatal elevation, no uvular deviation    CN XI 5/5 head turn and 5/5 shoulder shrug bilaterally    CN XII midline tongue protrusion    Motor:  Muscle bulk: normal, tone normal. Appears to have a drift in left upper extremity when testing the left upper extremity muscles individually, she has no weakness with encouragement.  Mvmt Root Nerve  Muscle Right Left Comments  SA C5/6 Ax Deltoid 5 5   EF C5/6 Mc Biceps 5 5   EE C6/7/8 Rad Triceps 5 5   WF C6/7 Med FCR     WE C7/8 PIN ECU     F Ab C8/T1 U ADM/FDI 5 5   HF L1/2/3 Fem Illopsoas 5 5   KE L2/3/4 Fem Quad 5 5   DF L4/5 D Peron Tib Ant 5 5   PF S1/2 Tibial Grc/Sol 5 5    Sensation:  Light touch Intact throughout   Pin prick    Temperature    Vibration   Proprioception    Coordination/Complex Motor:  - Finger to Nose intact  bilaterally - Heel to shin intact bilaterally - Rapid alternating movement are slowed throughout - Gait: Deferred for patient's safety. Labs   CBC:  Recent Labs  Lab 04/26/22 1430 04/26/22 1434  WBC 2.6*  --   NEUTROABS 0.9*  --   HGB 13.1 13.9  HCT 39.1 41.0  MCV 85.2  --   PLT 216  --     Basic Metabolic Panel:  Lab Results  Component Value Date   NA 138 04/26/2022   K 4.2 04/26/2022   CO2 25 04/26/2022   GLUCOSE 90 04/26/2022   BUN 9 04/26/2022   CREATININE 0.90 04/26/2022   CALCIUM 8.7 (L) 04/26/2022   GFRNONAA >60 04/26/2022   GFRAA >60 09/14/2017   Lipid Panel: No results found for: "LDLCALC" HgbA1c: No results found for: "HGBA1C" Urine Drug Screen:     Component Value Date/Time   LABOPIA NONE DETECTED 09/14/2017 2018   COCAINSCRNUR NONE DETECTED 09/14/2017 2018   LABBENZ NONE DETECTED 09/14/2017 2018   AMPHETMU NONE DETECTED 09/14/2017 2018   THCU NONE DETECTED 09/14/2017 2018   LABBARB (A) 09/14/2017 2018    Result not available. Reagent lot number recalled by manufacturer.    Alcohol Level     Component Value Date/Time   ETH <10 04/26/2022 1430    CT Head without contrast(Personally reviewed): CTH was negative for a  large hypodensity concerning for a large territory infarct or hyperdensity concerning for an [place]  CT angio Head and Neck with contrast(Personally reviewed): No LVO  CT perfusion(personally reviewed): No mismatch.  MRI Brain(Personally reviewed): Pending  Impression   Christine Alvarado is a 42 y.o. female with PMH significant for GERD, trigeminal neuralgia who presents with 3 to 4-day history of cough and some chest pain and tested positive for COVID-19 at home.  Today, she passed out at 1300 and woke up with some left-sided weakness and numbness along with only left eye left-sided hemianopsia?  Her symptoms are pretty distractible specifically her weakness.  When I encouraged her to continue to exert strength in her left upper  extremity, her strength improved to a 5 out of 5.  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  We obtained CT head which was negative, CT angio with no LVO, CT perfusion with no mismatch.  My overall suspicion for stroke was low and that she was not offered TNKase.  She did not have a large vessel occlusion on the CT angio and thus not offered thrombectomy.  Recommendations  - Our plan is to get an MRI of her brain without contrast to definitively rule out a stroke and if that is negative, no further workup. ______________________________________________________________________   Thank you for the opportunity to take part in the care of this patient. If you have any further questions, please contact the neurology consultation attending.  Signed,  Donnetta Simpers [facility] Pager Number [phone] _ _ _   _ __   _ __ _ _  __ __   _ __   __ _  Update: 5:19 PM 04/26/2022 Personally reviewed MRI Brain w/o contrast which is negative for acute stroke. No further workup at this time.  Discussed with Dr. Nechama Guard with the ED team.

## 2022-05-24 ENCOUNTER — Other Ambulatory Visit: Payer: Self-pay

## 2022-05-24 DIAGNOSIS — B379 Candidiasis, unspecified: Secondary | ICD-10-CM

## 2022-05-24 MED ORDER — FLUCONAZOLE 150 MG PO TABS: 150.0000 mg | ORAL_TABLET | Freq: Once | ORAL | 0 refills | Status: AC

## 2022-06-13 ENCOUNTER — Encounter (HOSPITAL_COMMUNITY): Payer: Self-pay

## 2022-06-13 ENCOUNTER — Other Ambulatory Visit: Payer: Self-pay

## 2022-06-13 ENCOUNTER — Emergency Department (HOSPITAL_COMMUNITY)
Admission: EM | Admit: 2022-06-13 | Discharge: 2022-06-14 | Disposition: A | Discharge status: Other Acute Inpatient Hospital | Payer: BC Managed Care – PPO | Attending: Emergency Medicine | Admitting: Emergency Medicine

## 2022-06-13 DIAGNOSIS — Z794 Long term (current) use of insulin: Secondary | ICD-10-CM | POA: Diagnosis not present

## 2022-06-13 DIAGNOSIS — X838XXA Intentional self-harm by other specified means, initial encounter: Secondary | ICD-10-CM | POA: Insufficient documentation

## 2022-06-13 DIAGNOSIS — Z20822 Contact with and (suspected) exposure to covid-19: Secondary | ICD-10-CM | POA: Diagnosis not present

## 2022-06-13 DIAGNOSIS — T426X2A Poisoning by other antiepileptic and sedative-hypnotic drugs, intentional self-harm, initial encounter: Secondary | ICD-10-CM | POA: Insufficient documentation

## 2022-06-13 DIAGNOSIS — T1491XA Suicide attempt, initial encounter: Secondary | ICD-10-CM | POA: Diagnosis not present

## 2022-06-13 DIAGNOSIS — T50902A Poisoning by unspecified drugs, medicaments and biological substances, intentional self-harm, initial encounter: Secondary | ICD-10-CM | POA: Diagnosis present

## 2022-06-13 DIAGNOSIS — R45851 Suicidal ideations: Secondary | ICD-10-CM

## 2022-06-13 DIAGNOSIS — T50901A Poisoning by unspecified drugs, medicaments and biological substances, accidental (unintentional), initial encounter: Secondary | ICD-10-CM | POA: Diagnosis present

## 2022-06-13 LAB — CBC WITH DIFFERENTIAL/PLATELET
Abs Immature Granulocytes: 0 10*3/uL (ref 0.00–0.07)
Basophils Absolute: 0 10*3/uL (ref 0.0–0.1)
Basophils Relative: 1 %
Eosinophils Absolute: 0.1 10*3/uL (ref 0.0–0.5)
Eosinophils Relative: 2 %
HCT: 37.8 % (ref 36.0–46.0)
Hemoglobin: 12.2 g/dL (ref 12.0–15.0)
Immature Granulocytes: 0 %
Lymphocytes Relative: 36 %
Lymphs Abs: 1.3 10*3/uL (ref 0.7–4.0)
MCH: 28.1 pg (ref 26.0–34.0)
MCHC: 32.3 g/dL (ref 30.0–36.0)
MCV: 87.1 fL (ref 80.0–100.0)
Monocytes Absolute: 0.3 10*3/uL (ref 0.1–1.0)
Monocytes Relative: 9 %
Neutro Abs: 1.9 10*3/uL (ref 1.7–7.7)
Neutrophils Relative %: 52 %
Platelets: 210 10*3/uL (ref 150–400)
RBC: 4.34 MIL/uL (ref 3.87–5.11)
RDW: 13.1 % (ref 11.5–15.5)
WBC: 3.7 10*3/uL — ABNORMAL LOW (ref 4.0–10.5)
nRBC: 0 % (ref 0.0–0.2)

## 2022-06-13 LAB — COMPREHENSIVE METABOLIC PANEL
ALT: 12 U/L (ref 0–44)
AST: 13 U/L — ABNORMAL LOW (ref 15–41)
Albumin: 3.7 g/dL (ref 3.5–5.0)
Alkaline Phosphatase: 58 U/L (ref 38–126)
Anion gap: 1 — ABNORMAL LOW (ref 5–15)
BUN: 9 mg/dL (ref 6–20)
CO2: 25 mmol/L (ref 22–32)
Calcium: 8.2 mg/dL — ABNORMAL LOW (ref 8.9–10.3)
Chloride: 111 mmol/L (ref 98–111)
Creatinine, Ser: 0.84 mg/dL (ref 0.44–1.00)
GFR, Estimated: >60 mL/min (ref >60)
Glucose, Bld: 83 mg/dL (ref 70–99)
Potassium: 3.8 mmol/L (ref 3.5–5.1)
Sodium: 137 mmol/L (ref 135–145)
Total Bilirubin: 0.8 mg/dL (ref 0.3–1.2)
Total Protein: 6.3 g/dL — ABNORMAL LOW (ref 6.5–8.1)

## 2022-06-13 LAB — CBG MONITORING, ED: Glucose-Capillary: 72 mg/dL (ref 70–99)

## 2022-06-13 LAB — BLOOD GAS, VENOUS
Acid-Base Excess: 0.8 mmol/L (ref 0.0–2.0)
Bicarbonate: 26.6 mmol/L (ref 20.0–28.0)
O2 Saturation: 58.9 %
Patient temperature: 37
pCO2, Ven: 46 mmHg (ref 44–60)
pH, Ven: 7.37 (ref 7.25–7.43)
pO2, Ven: 32 mmHg (ref 32–45)

## 2022-06-13 LAB — ETHANOL: Alcohol, Ethyl (B): <10 mg/dL (ref <10)

## 2022-06-13 LAB — BASIC METABOLIC PANEL
Anion gap: 5 (ref 5–15)
BUN: 8 mg/dL (ref 6–20)
CO2: 24 mmol/L (ref 22–32)
Calcium: 8 mg/dL — ABNORMAL LOW (ref 8.9–10.3)
Chloride: 108 mmol/L (ref 98–111)
Creatinine, Ser: 0.84 mg/dL (ref 0.44–1.00)
GFR, Estimated: >60 mL/min (ref >60)
Glucose, Bld: 79 mg/dL (ref 70–99)
Potassium: 3.7 mmol/L (ref 3.5–5.1)
Sodium: 137 mmol/L (ref 135–145)

## 2022-06-13 LAB — RAPID URINE DRUG SCREEN, HOSP PERFORMED
Amphetamines: NOT DETECTED
Barbiturates: NOT DETECTED
Benzodiazepines: NOT DETECTED
Cocaine: NOT DETECTED
Opiates: NOT DETECTED
Tetrahydrocannabinol: POSITIVE — AB

## 2022-06-13 LAB — SALICYLATE LEVEL: Salicylate Lvl: <7 mg/dL — ABNORMAL LOW (ref 7.0–30.0)

## 2022-06-13 LAB — PREGNANCY, URINE: Preg Test, Ur: NEGATIVE

## 2022-06-13 LAB — ACETAMINOPHEN LEVEL
Acetaminophen (Tylenol), Serum: <10 ug/mL — ABNORMAL LOW (ref 10–30)
Acetaminophen (Tylenol), Serum: <10 ug/mL — ABNORMAL LOW (ref 10–30)

## 2022-06-13 LAB — MAGNESIUM: Magnesium: 2.1 mg/dL (ref 1.7–2.4)

## 2022-06-13 MED ORDER — SODIUM CHLORIDE 0.9 % IV BOLUS
1000.0000 mL | Freq: Once | INTRAVENOUS | Status: AC
  Administered 2022-06-13: 1000 mL via INTRAVENOUS

## 2022-06-13 MED ORDER — CHARCOAL ACTIVATED PO LIQD
80.0000 mg | Freq: Once | ORAL | Status: DC
  Filled 2022-06-13: qty 240

## 2022-06-13 NOTE — ED Provider Notes (Signed)
Seen after prior EDP.  Patient is medically clear for psychiatric evaluation.   Valarie Merino, MD 06/13/22 [phone]

## 2022-06-13 NOTE — ED Notes (Signed)
Family asked why her phone and belongings had to be took before coming back, explained policy and the safety of it

## 2022-06-13 NOTE — ED Notes (Signed)
Poison control contacted   Watch for Gabapentin: CNS depression, ataxia, slowed reflexes Trileptal: seizures, tachycardia, hypertension, hypotension, hyponatremia  Recommend Activated charcoal 1g/kg without sorbitol Benzos for supportive care and if not working phenobarbital and if not working then intubation with propofol Fluids for supportive care as needed  Repeat Tylenol 4 hours after ingestion Repeat BMP 6 hours after ingesetion Repeat EKG before medically clearing

## 2022-06-13 NOTE — ED Triage Notes (Addendum)
Pt coming from [place] with c/o intentional overdose in attempt to kill herself. Per EMS pt states that she took roughly 1/2 bottle of her 100mg  gabapentin pills and unknown amount of trileptal 150mg  pills around 1330 today. Max pill amount in gabapentin bottle is 90. Pt also states that she took "more trileptal than gabapentin."

## 2022-06-13 NOTE — ED Notes (Addendum)
One bag of patient belongings including clothes, shoes, phone, smart watch, bracelet, medications, and two necklaces placed in cupboards labeled resus A.

## 2022-06-13 NOTE — ED Notes (Signed)
All of patient's belongings sent with patient's mother.

## 2022-06-13 NOTE — ED Provider Notes (Signed)
[facility] Provider Note  CSN: [id number] Arrival date & time: 06/13/22 1458  Chief Complaint(s) Suicide Attempt  HPI Christine Alvarado is a 42 y.o. female with past medical history as below, significant for GERD, trigeminal nerve disease, obesity, who presents to the ED with complaint of suicide attempt.  Patient reported approximately 90 minutes prior to arrival she attempted to kill herself by overdosing on her home medications.  Ongoing suicidal ideation, no homicidal ideation.  Patient reports that she went to a gas station to kill herself, she took unknown quantity of Trileptal and gabapentin.  No other coingestions reported.  No alcohol or illicit drug use.  Patient called family numbers just prior to the alleged ingestion and gave them her life insurance information so they could be "taking care of" after she was dead.  No nausea or vomiting, no chest pain or dyspnea.  No rashes.  Does report feeling somewhat sleepy.  Reports attempt to kill himself many years ago by similar means.  Past Medical History Past Medical History:  Diagnosis Date   GERD (gastroesophageal reflux disease)    Nerve disorder    Trigeminal nerve disease    Patient Active Problem List   Diagnosis Date Noted   ASCUS of cervix with negative high risk HPV 10/02/2020   PID (acute pelvic inflammatory disease)    Gonorrhea    Morbid obesity due to excess calories (HCC)    Liver lesion    Abdominal pain, acute 12/06/2015   Sepsis ([place]) 12/06/2015   Trichomoniasis 12/06/2015   Bacterial vaginosis 12/06/2015   Leg edema 12/06/2015   Generalized abdominal pain    Home Medication(s) Prior to Admission medications   Medication Sig Start Date End Date Taking? Authorizing Provider  dicyclomine (BENTYL) 10 MG capsule Take 10 mg by mouth 3 (three) times daily. 02/06/19   [provider]  metroNIDAZOLE (FLAGYL) 500 MG tablet Take 1 tablet (500 mg total) by mouth 2  (two) times daily. 09/24/21   Luvenia Redden, PA-C  metroNIDAZOLE (METROGEL VAGINAL) 0.75 % vaginal gel Place 1 Applicatorful vaginally at bedtime. Use for 5 nights. 10/29/21   Caren Macadam, MD  omeprazole (PRILOSEC) 20 MG capsule Take 20 mg by mouth 2 (two) times daily. 12/31/18 09/24/21  [provider]  OXcarbazepine (TRILEPTAL) 150 MG tablet 300 mg in the morning, at noon, and at bedtime. 01/11/19   [provider]  Semaglutide-Weight Management (WEGOVY) 1 MG/0.5ML SOAJ Inject 1 mg into the skin.    [provider]  ursodiol (ACTIGALL) 250 MG tablet 250 mg 2 (two) times daily. Patient not taking: Reported on 09/24/2021 12/31/18   [provider]                                                                                                                                    Past Surgical History Past Surgical History:  Procedure Laterality Date   BRAIN SURGERY     CARPAL TUNNEL RELEASE Right 04/28/2020   Procedure: CARPAL TUNNEL RELEASE;  Surgeon: Leanora Cover, MD;  Location: [facility];  Service: Orthopedics;  Laterality: Right;   LAPAROSCOPIC GASTRIC BAND REMOVAL WITH LAPAROSCOPIC GASTRIC SLEEVE RESECTION  01/08/2019   OTHER SURGICAL HISTORY     microvascular decompression of left side of brain   Family History Family History  Problem Relation Age of Onset   Breast cancer Mother 14   Hypertension Mother    Hyperlipidemia Mother    Heart disease Father    Hyperlipidemia Father    Stroke Father    Diabetes Mellitus I Sister    Breast cancer Maternal Aunt     Social History Social History   Tobacco Use   Smoking status: Never   Smokeless tobacco: Never  Vaping Use   Vaping Use: Never used  Substance Use Topics   Alcohol use: Not Currently    Comment: occ   Drug use: No   Allergies Carbamazepine, Dilantin [phenytoin], and Tape  Review of Systems Review of Systems  Constitutional:  Negative for activity change and  fever.  HENT:  Negative for facial swelling and trouble swallowing.   Eyes:  Negative for discharge and redness.  Respiratory:  Negative for cough and shortness of breath.   Cardiovascular:  Negative for chest pain and palpitations.  Gastrointestinal:  Negative for abdominal pain and nausea.  Genitourinary:  Negative for dysuria and flank pain.  Musculoskeletal:  Negative for back pain and gait problem.  Skin:  Negative for pallor and rash.  Neurological:  Negative for syncope and headaches.  Psychiatric/Behavioral:  Positive for suicidal ideas. The patient is nervous/anxious.     Physical Exam Vital Signs  I have reviewed the triage vital signs BP 122/81   Pulse 78   Temp 98.5 F (36.9 C) (Oral)   Resp 14   Ht 5\' 4"  (1.626 m)   Wt 83 kg   LMP 05/28/2022 (Approximate)   SpO2 100%   BMI 31.41 kg/m  Physical Exam Vitals and nursing note reviewed.  Constitutional:      General: She is not in acute distress.    Appearance: Normal appearance. She is not ill-appearing.  HENT:     Head: Normocephalic and atraumatic.     Right Ear: External ear normal.     Left Ear: External ear normal.     Nose: Nose normal.     Mouth/Throat:     Mouth: Mucous membranes are moist.  Eyes:     General: No scleral icterus.       Right eye: No discharge.        Left eye: No discharge.  Cardiovascular:     Rate and Rhythm: Normal rate and regular rhythm.     Pulses: Normal pulses.     Heart sounds: Normal heart sounds.  Pulmonary:     Effort: Pulmonary effort is normal. No respiratory distress.     Breath sounds: Normal breath sounds.  Abdominal:     General: Abdomen is flat.     Palpations: Abdomen is soft.     Tenderness: There is no abdominal tenderness.  Musculoskeletal:     Right lower leg: No edema.     Left lower leg: No edema.  Skin:    General: Skin is warm and dry.     Capillary Refill: Capillary refill takes less than 2 seconds.  Neurological:     Mental Status:  She is  alert and oriented to person, place, and time.     GCS: GCS eye subscore is 4. GCS verbal subscore is 5. GCS motor subscore is 6.     Cranial Nerves: Cranial nerves 2-12 are intact.     Sensory: Sensation is intact.     Motor: Motor function is intact.     Coordination: Coordination is intact.     Comments: Sleepy but easily arousable to voice Symmetric 5/5 bilateral upper and lower extremities, no clonus lower extremities EOMI, no nystagmus   Psychiatric:        Mood and Affect: Mood normal.        Behavior: Behavior normal.     ED Results and Treatments Labs (all labs ordered are listed, but only abnormal results are displayed) Labs Reviewed  COMPREHENSIVE METABOLIC PANEL - Abnormal; Notable for the following components:      Result Value   Calcium 8.2 (*)    Total Protein 6.3 (*)    AST 13 (*)    Anion gap 1 (*)    All other components within normal limits  SALICYLATE LEVEL - Abnormal; Notable for the following components:   Salicylate Lvl Q000111Q (*)    All other components within normal limits  ACETAMINOPHEN LEVEL - Abnormal; Notable for the following components:   Acetaminophen (Tylenol), Serum <10 (*)    All other components within normal limits  RAPID URINE DRUG SCREEN, HOSP PERFORMED - Abnormal; Notable for the following components:   Tetrahydrocannabinol POSITIVE (*)    All other components within normal limits  CBC WITH DIFFERENTIAL/PLATELET - Abnormal; Notable for the following components:   WBC 3.7 (*)    All other components within normal limits  ETHANOL  BLOOD GAS, VENOUS  MAGNESIUM  PREGNANCY, URINE  ACETAMINOPHEN LEVEL  BASIC METABOLIC PANEL  CBG MONITORING, ED                                                                                                                          Radiology No results found.  Pertinent labs & imaging results that were available during my care of the patient were reviewed by me and considered in my medical decision making  (see MDM for details).  Medications Ordered in ED Medications  sodium chloride 0.9 % bolus 1,000 mL (1,000 mLs Intravenous New Bag/Given 06/13/22 1609)  Procedures Procedures  (including critical care time)  Medical Decision Making / ED Course    Medical Decision Making:    MALAYNA TEST is a 42 y.o. female with past medical history as below, significant for GERD, trigeminal nerve disease, obesity, who presents to the ED with complaint of suicide attempt. . The complaint involves an extensive differential diagnosis and also carries with it a high risk of complications and morbidity.  Serious etiology was considered. Ddx includes but is not limited to: Psychiatric disturbance, suicide attempt, unknown, ingestion, medication effect, etc.  Complete initial physical exam performed, notably the patient  was in no acute distress, breathing comfortably ambient air.  Neuroexam is nonfocal.  Ongoing suicidal thoughts.    Reviewed and confirmed nursing documentation for past medical history, family history, social history.  Vital signs reviewed.    Clinical Course as of 06/13/22 1720  Mon Jun 13, 2022  1611 D/w poison control, okay with holding off charcoal. Recommend 6 hours obs. Rpt apap at 4 hours s/p [SG]  1612 Can possibly clear at 8pm pending labs  [SG]  1708 IVC completed [SG]  1708 Labs reviewed, stable.  Vital signs remain stable.  Patient asking if she can fill out DNR paperwork [SG]    Clinical Course User Index [SG] Wynona Dove A, DO   Pt with intentional overdose as suicide attempt; she is pending medical clearance at shift change. IVC was completed. Care signed out to Dr Francia Greaves at shift change/pending medical clearance. She is HDS at time of shift change.       Additional history obtained: -Additional history obtained from NA -External  records from outside source obtained and reviewed including: Chart review including previous notes, labs, imaging, consultation notes including prior ED visits, home medications, prior labs and imaging   Lab Tests: -I ordered, reviewed, and interpreted labs.   The pertinent results include:   Labs Reviewed  COMPREHENSIVE METABOLIC PANEL - Abnormal; Notable for the following components:      Result Value   Calcium 8.2 (*)    Total Protein 6.3 (*)    AST 13 (*)    Anion gap 1 (*)    All other components within normal limits  SALICYLATE LEVEL - Abnormal; Notable for the following components:   Salicylate Lvl Q000111Q (*)    All other components within normal limits  ACETAMINOPHEN LEVEL - Abnormal; Notable for the following components:   Acetaminophen (Tylenol), Serum <10 (*)    All other components within normal limits  RAPID URINE DRUG SCREEN, HOSP PERFORMED - Abnormal; Notable for the following components:   Tetrahydrocannabinol POSITIVE (*)    All other components within normal limits  CBC WITH DIFFERENTIAL/PLATELET - Abnormal; Notable for the following components:   WBC 3.7 (*)    All other components within normal limits  ETHANOL  BLOOD GAS, VENOUS  MAGNESIUM  PREGNANCY, URINE  ACETAMINOPHEN LEVEL  BASIC METABOLIC PANEL  CBG MONITORING, ED    Notable for mild leukopenia, THC +tive  EKG   EKG Interpretation  Date/Time:  Monday June 13 2022 15:59:03 EDT Ventricular Rate:  74 PR Interval:  131 QRS Duration: 80 QT Interval:  365 QTC Calculation: 405 R Axis:   46 Text Interpretation: Sinus rhythm Minimal ST elevation, inferior leads similar to prior no stemi  Confirmed by Wynona Dove ([id number]) on 06/13/2022 4:11:48 PM         Imaging Studies ordered: I ordered imaging studies including na   Medicines ordered and prescription  drug management: Meds ordered this encounter  Medications   sodium chloride 0.9 % bolus 1,000 mL   DISCONTD: charcoal activated (NO SORBITOL)  (ACTIDOSE-AQUA) suspension 0.0792 g    -I have reviewed the patients home medicines and have made adjustments as needed   Consultations Obtained: I requested consultation with the poison control,  and discussed lab and imaging findings as well as pertinent plan - they recommend: see nursing note, in brief, repeat Tylenol 4 hours, repeat BMP at 6 hours, repeat EKG just prior to medical clearance.   Cardiac Monitoring: The patient was maintained on a cardiac monitor.  I personally viewed and interpreted the cardiac monitored which showed an underlying rhythm of: NSR  Social Determinants of Health:  Diagnosis or treatment significantly limited by social determinants of health: na   Reevaluation: After the interventions noted above, I reevaluated the patient and found that they have stayed the same  Co morbidities that complicate the patient evaluation  Past Medical History:  Diagnosis Date   GERD (gastroesophageal reflux disease)    Nerve disorder    Trigeminal nerve disease       Dispostion: Disposition decision including need for hospitalization was considered, and patient disposition pending at time of sign out.    Final Clinical Impression(s) / ED Diagnoses Final diagnoses:  Suicide attempt [facility])     This chart was dictated using voice recognition software.  Despite best efforts to proofread,  errors can occur which can change the documentation meaning.    Jeanell Sparrow, DO 06/13/22 1720

## 2022-06-14 ENCOUNTER — Encounter: Payer: Self-pay | Admitting: Psychiatry

## 2022-06-14 ENCOUNTER — Other Ambulatory Visit: Payer: Self-pay

## 2022-06-14 ENCOUNTER — Inpatient Hospital Stay
Admission: AD | Admit: 2022-06-14 | Discharge: 2022-06-17 | DRG: 918 | Disposition: A | Discharge status: Home / Self Care | Payer: BC Managed Care – PPO | Source: Intra-hospital | Attending: Psychiatry | Admitting: Psychiatry

## 2022-06-14 DIAGNOSIS — T426X2A Poisoning by other antiepileptic and sedative-hypnotic drugs, intentional self-harm, initial encounter: Secondary | ICD-10-CM | POA: Diagnosis present

## 2022-06-14 DIAGNOSIS — Z8249 Family history of ischemic heart disease and other diseases of the circulatory system: Secondary | ICD-10-CM

## 2022-06-14 DIAGNOSIS — F332 Major depressive disorder, recurrent severe without psychotic features: Secondary | ICD-10-CM | POA: Insufficient documentation

## 2022-06-14 DIAGNOSIS — Z888 Allergy status to other drugs, medicaments and biological substances status: Secondary | ICD-10-CM | POA: Diagnosis not present

## 2022-06-14 DIAGNOSIS — F419 Anxiety disorder, unspecified: Secondary | ICD-10-CM | POA: Diagnosis present

## 2022-06-14 DIAGNOSIS — Y929 Unspecified place or not applicable: Secondary | ICD-10-CM

## 2022-06-14 DIAGNOSIS — R4589 Other symptoms and signs involving emotional state: Secondary | ICD-10-CM | POA: Diagnosis present

## 2022-06-14 DIAGNOSIS — G8929 Other chronic pain: Secondary | ICD-10-CM | POA: Diagnosis present

## 2022-06-14 DIAGNOSIS — Z833 Family history of diabetes mellitus: Secondary | ICD-10-CM | POA: Diagnosis not present

## 2022-06-14 DIAGNOSIS — K30 Functional dyspepsia: Secondary | ICD-10-CM | POA: Diagnosis not present

## 2022-06-14 DIAGNOSIS — Z818 Family history of other mental and behavioral disorders: Secondary | ICD-10-CM | POA: Diagnosis not present

## 2022-06-14 DIAGNOSIS — K219 Gastro-esophageal reflux disease without esophagitis: Secondary | ICD-10-CM | POA: Diagnosis present

## 2022-06-14 DIAGNOSIS — R4587 Impulsiveness: Secondary | ICD-10-CM | POA: Diagnosis present

## 2022-06-14 DIAGNOSIS — Z823 Family history of stroke: Secondary | ICD-10-CM

## 2022-06-14 DIAGNOSIS — T50901A Poisoning by unspecified drugs, medicaments and biological substances, accidental (unintentional), initial encounter: Secondary | ICD-10-CM | POA: Diagnosis present

## 2022-06-14 DIAGNOSIS — T421X2A Poisoning by iminostilbenes, intentional self-harm, initial encounter: Principal | ICD-10-CM | POA: Diagnosis present

## 2022-06-14 DIAGNOSIS — Z79899 Other long term (current) drug therapy: Secondary | ICD-10-CM

## 2022-06-14 DIAGNOSIS — Z91048 Other nonmedicinal substance allergy status: Secondary | ICD-10-CM | POA: Diagnosis not present

## 2022-06-14 DIAGNOSIS — Z83438 Family history of other disorder of lipoprotein metabolism and other lipidemia: Secondary | ICD-10-CM | POA: Diagnosis not present

## 2022-06-14 DIAGNOSIS — T1491XA Suicide attempt, initial encounter: Secondary | ICD-10-CM | POA: Diagnosis not present

## 2022-06-14 DIAGNOSIS — G500 Trigeminal neuralgia: Secondary | ICD-10-CM | POA: Diagnosis present

## 2022-06-14 DIAGNOSIS — R45851 Suicidal ideations: Secondary | ICD-10-CM

## 2022-06-14 LAB — RESP PANEL BY RT-PCR (RSV, FLU A&B, COVID)  RVPGX2
Influenza A by PCR: NEGATIVE
Influenza B by PCR: NEGATIVE
Resp Syncytial Virus by PCR: NEGATIVE
SARS Coronavirus 2 by RT PCR: NEGATIVE

## 2022-06-14 MED ORDER — GABAPENTIN 300 MG PO CAPS
600.0000 mg | ORAL_CAPSULE | Freq: Two times a day (BID) | ORAL | Status: DC
  Administered 2022-06-14: 600 mg via ORAL
  Filled 2022-06-14: qty 2

## 2022-06-14 MED ORDER — LORAZEPAM 2 MG PO TABS: 2.0000 mg | ORAL_TABLET | Freq: Three times a day (TID) | ORAL | Status: DC | PRN

## 2022-06-14 MED ORDER — DIPHENHYDRAMINE HCL 25 MG PO CAPS: 50.0000 mg | ORAL_CAPSULE | Freq: Three times a day (TID) | ORAL | Status: DC | PRN

## 2022-06-14 MED ORDER — ACETAMINOPHEN 325 MG PO TABS: 650.0000 mg | ORAL_TABLET | Freq: Four times a day (QID) | ORAL | Status: DC | PRN

## 2022-06-14 MED ORDER — LORAZEPAM 2 MG/ML IJ SOLN: 2.0000 mg | Freq: Three times a day (TID) | INTRAMUSCULAR | Status: DC | PRN

## 2022-06-14 MED ORDER — OXCARBAZEPINE 300 MG PO TABS
300.0000 mg | ORAL_TABLET | Freq: Two times a day (BID) | ORAL | Status: DC
  Administered 2022-06-14: 300 mg via ORAL
  Filled 2022-06-14: qty 1

## 2022-06-14 MED ORDER — IBUPROFEN 800 MG PO TABS: 800.0000 mg | ORAL_TABLET | Freq: Once | ORAL | Status: DC

## 2022-06-14 MED ORDER — DIPHENHYDRAMINE HCL 50 MG/ML IJ SOLN: 50.0000 mg | Freq: Three times a day (TID) | INTRAMUSCULAR | Status: DC | PRN

## 2022-06-14 MED ORDER — MAGNESIUM HYDROXIDE 400 MG/5ML PO SUSP: 30.0000 mL | Freq: Every day | ORAL | Status: DC | PRN

## 2022-06-14 MED ORDER — OXCARBAZEPINE 300 MG PO TABS
300.0000 mg | ORAL_TABLET | Freq: Two times a day (BID) | ORAL | Status: DC
  Administered 2022-06-14 – 2022-06-17 (×6): 300 mg via ORAL
  Filled 2022-06-14 (×7): qty 1

## 2022-06-14 MED ORDER — FAMOTIDINE 20 MG PO TABS: 40.0000 mg | ORAL_TABLET | Freq: Every day | ORAL | Status: DC | PRN

## 2022-06-14 MED ORDER — GABAPENTIN 300 MG PO CAPS
600.0000 mg | ORAL_CAPSULE | Freq: Two times a day (BID) | ORAL | Status: DC
  Administered 2022-06-14 – 2022-06-17 (×6): 600 mg via ORAL
  Filled 2022-06-14 (×6): qty 2

## 2022-06-14 MED ORDER — HALOPERIDOL LACTATE 5 MG/ML IJ SOLN: 5.0000 mg | Freq: Three times a day (TID) | INTRAMUSCULAR | Status: DC | PRN

## 2022-06-14 MED ORDER — ALUM & MAG HYDROXIDE-SIMETH 200-200-20 MG/5ML PO SUSP: 30.0000 mL | ORAL | Status: DC | PRN

## 2022-06-14 MED ORDER — PANTOPRAZOLE SODIUM 40 MG PO TBEC
40.0000 mg | DELAYED_RELEASE_TABLET | Freq: Every day | ORAL | Status: DC
  Administered 2022-06-14: 40 mg via ORAL
  Filled 2022-06-14: qty 1

## 2022-06-14 MED ORDER — PANTOPRAZOLE SODIUM 40 MG PO TBEC
40.0000 mg | DELAYED_RELEASE_TABLET | Freq: Every day | ORAL | Status: DC
  Administered 2022-06-15 – 2022-06-17 (×2): 40 mg via ORAL
  Filled 2022-06-14 (×3): qty 1

## 2022-06-14 MED ORDER — FAMOTIDINE 20 MG PO TABS
40.0000 mg | ORAL_TABLET | Freq: Every day | ORAL | Status: DC | PRN
  Administered 2022-06-14 – 2022-06-16 (×2): 40 mg via ORAL
  Filled 2022-06-14 (×2): qty 2

## 2022-06-14 MED ORDER — HALOPERIDOL 5 MG PO TABS: 5.0000 mg | ORAL_TABLET | Freq: Three times a day (TID) | ORAL | Status: DC | PRN

## 2022-06-14 MED ORDER — TRAZODONE HCL 50 MG PO TABS: 50.0000 mg | ORAL_TABLET | Freq: Every evening | ORAL | Status: DC | PRN

## 2022-06-14 MED ORDER — ACETAMINOPHEN 325 MG PO TABS
650.0000 mg | ORAL_TABLET | Freq: Once | ORAL | Status: AC
  Administered 2022-06-14: 650 mg via ORAL
  Filled 2022-06-14: qty 2

## 2022-06-14 MED ORDER — HYDROXYZINE HCL 25 MG PO TABS: 25.0000 mg | ORAL_TABLET | Freq: Three times a day (TID) | ORAL | Status: DC | PRN

## 2022-06-14 NOTE — ED Notes (Signed)
Report to Truman Hayward, Therapist, sports at [facility].

## 2022-06-14 NOTE — Tx Team (Signed)
Initial Treatment Plan 06/14/2022 6:18 PM Cadey L Garate [id number]    PATIENT STRESSORS: Financial difficulties   Marital or family conflict     PATIENT STRENGTHS: Motivation for treatment/growth    PATIENT IDENTIFIED PROBLEMS: Anxiety, depression                     DISCHARGE CRITERIA:  Improved stabilization in mood, thinking, and/or behavior  PRELIMINARY DISCHARGE PLAN: Return to previous living arrangement  PATIENT/FAMILY INVOLVEMENT: This treatment plan has been presented to and reviewed with the patient, Charnetta L Baskins. The patient and family have been given the opportunity to ask questions and make suggestions.  Donette Larry, RN 06/14/2022, 6:18 PM

## 2022-06-14 NOTE — Progress Notes (Signed)
Patient ID: Nilda Simmer, female   DOB: March 20, 1981, 42 y.o.   MRN: [id number] Admission note: Patient is a 42 year old female, presents IVC per report of suicidal behavior. Patient is alert and oriented to unit. Patient is cooperative during assessment questions. Patient states that "I took a bunch of pills because I was tired of living and everyone was getting on my nerves." Patient identified stressors related to admission as "my boss, finances, and my youngest kid." Patient presents calm and cooperative. Patient currently denies SI/HI/AVH. Patient states that her goal while being on the  unit is "standing up for myself." Unit policies explained and verbalized understanding. Q15 minute checks maintained and will continue to monitor.

## 2022-06-14 NOTE — Progress Notes (Signed)
Pt was accepted to [facility] 06/14/2022, pending IVC paperwork faxed to [phone]. Bed assignment: [id number]  Pt meets inpatient criteria per Michaele Offer, NP  Attending Physician will be Alethia Berthold, MD  Report can be called to: [phone]  Pt can arrive after pending items are received  Care Team Notified: [facility] Lynnda Shields, RN, Claretta Fraise, RN, Michaele Offer, NP, Alethia Berthold, MD, Marlane Hatcher, NT, Lyda Kalata, RN, and Ernst Breach, RN  [place], [place]  06/14/2022 3:30 PM

## 2022-06-14 NOTE — Group Note (Signed)
Date:  06/14/2022 Time:  9:03 PM  Group Topic/Focus:  Wrap-Up Group:   The focus of this group is to help patients review their daily goal of treatment and discuss progress on daily workbooks.    Participation Level:  Minimal  Participation Quality:  Appropriate and Attentive  Affect:  Appropriate  Cognitive:  Alert, Appropriate, and Oriented  Insight: Good  Engagement in Group:  Limited  Modes of Intervention:  Clarification  Additional Comments:    Christine Alvarado 06/14/2022, 9:03 PM

## 2022-06-14 NOTE — ED Provider Notes (Signed)
Patient accepted to [facility] by Dr. Weber Cooks  for psychiatric care. Safe transport to be provided.   Elgie Congo, MD 06/14/22 [phone]

## 2022-06-14 NOTE — Consult Note (Signed)
[facility] ASSESSMENT   Reason for Consult:  Psych Consult Referring Physician:  Dr. Francia Greaves  Patient Identification: Christine Alvarado MRN:  [id number] ED Chief Complaint: Overdose  Diagnosis:  Principal Problem:   Overdose Active Problems:   Suicidal ideation   ED Assessment Time Calculation: Start Time: 1100 Stop Time: 1145 Total Time in Minutes (Assessment Completion): 45    HPI: Per Triage Note: Pt coming from [place] with c/o intentional overdose in attempt to kill herself. Per EMS pt states that she took roughly 1/2 bottle of her 100mg  gabapentin pills and unknown amount of trileptal 150mg  pills around 1330 today. Max pill amount in gabapentin bottle is 90. Pt also states that she took "more trileptal than gabapentin."    Subjective:  Christine Alvarado, 42 y.o., female patient seen face to face by this provider, consulted with Dr. Dwyane Dee; and chart reviewed on 06/14/22.  On evaluation Christine Alvarado reports that she tried to kill herself, she states "life is ghetto and I had enough ".  She states she has a lot of stressors, such as job she works [employer] and her supervisor is very stressful, her kids she has for all in their 20s, and they still depend on her, she says money is a stressor and being sick she has trigeminal neuralgia which began in 2015 and she has a lot of facial pain.  She states she started taking a bunch of pills every day for her medical sickness.  Patient currently denies SI/HI/AVH, states she has no psychiatric history, states her appetite is fair and her sleep is good.  Patient states she does have intermittent thoughts of harming herself due to the stressors in her life.  Patient states she has no access to firearms, and she has no plan. Patient says that she was seeing a therapist in 2021, but did not finish because she thought she was doing better.  Patient endorsed using THC, denies using any illicit substances or alcohol.  Patient UDS positive for THC.  During evaluation Christine Alvarado is sitting up in her bed in no acute distress.  She is alert, oriented x 4, calm, cooperative and attentive. Her mood is depressed with congruent affect. She has normal speech, and behavior.  Objectively there is no evidence of psychosis/mania or delusional thinking.  Patient is able to converse coherently, goal directed thoughts, no distractibility, or pre-occupation.  She currently denies suicidal/self-harm.  Denies homicidal ideation, psychosis, and paranoia.  Patient answered question appropriately.  Patient has minimal insight into her situation, she states that she becomes vested at [employer] on April 1, states that she will either take some time off or put in a notice and look for another job.  Patient states she does have the support of her children.  During evaluation, patient's oldest daughter Jorene Minors who lives with patient came to visit, she realizes that her mother is under a lot of stress and states she wants the best for her mother and she wants her mother to get help.  She states that her mom's job is her biggest stressor.    Past Psychiatric History: No past psychiatric history noted.   Risk to Self or Others: Is the patient at risk to self? Yes Has the patient been a risk to self in the past 6 months? No Has the patient been a risk to self within the distant past? No Is the patient a risk to others? No Has the patient been a risk to others in  the past 6 months? No Has the patient been a risk to others within the distant past? No  [place] Scale:  [place] ED from 06/13/2022 in [facility] Emergency Department at [facility] ED from 04/26/2022 in [facility] Emergency Department at [facility] Admission (Discharged) from 04/28/2020 in [place] High Risk No Risk No Risk       AIMS:  , , ,  ,   ASAM:    Substance Abuse:     Past Medical History:  Past Medical History:  Diagnosis Date   GERD (gastroesophageal reflux disease)     Nerve disorder    Trigeminal nerve disease     Past Surgical History:  Procedure Laterality Date   BRAIN SURGERY     CARPAL TUNNEL RELEASE Right 04/28/2020   Procedure: CARPAL TUNNEL RELEASE;  Surgeon: Leanora Cover, MD;  Location: [facility];  Service: Orthopedics;  Laterality: Right;   LAPAROSCOPIC GASTRIC BAND REMOVAL WITH LAPAROSCOPIC GASTRIC SLEEVE RESECTION  01/08/2019   OTHER SURGICAL HISTORY     microvascular decompression of left side of brain   Family History:  Family History  Problem Relation Age of Onset   Breast cancer Mother 73   Hypertension Mother    Hyperlipidemia Mother    Heart disease Father    Hyperlipidemia Father    Stroke Father    Diabetes Mellitus I Sister    Breast cancer Maternal Aunt     Social History:  Social History   Substance and Sexual Activity  Alcohol Use Not Currently   Comment: occ     Social History   Substance and Sexual Activity  Drug Use No    Social History   Socioeconomic History   Marital status: Single    Spouse name: Not on file   Number of children: Not on file   Years of education: Not on file   Highest education level: Not on file  Occupational History   Not on file  Tobacco Use   Smoking status: Never   Smokeless tobacco: Never  Vaping Use   Vaping Use: Never used  Substance and Sexual Activity   Alcohol use: Not Currently    Comment: occ   Drug use: No   Sexual activity: Not on file  Other Topics Concern   Not on file  Social History Narrative   Not on file   Social Determinants of Health   Financial Resource Strain: Not on file  Food Insecurity: Food Insecurity Present (09/23/2020)   Hunger Vital Sign    Worried About Running Out of Food in the Last Year: Sometimes true    Ran Out of Food in the Last Year: Sometimes true  Transportation Needs: No Transportation Needs (09/23/2020)   PRAPARE - Hydrologist (Medical): No    Lack of Transportation  (Non-Medical): No  Physical Activity: Not on file  Stress: Not on file  Social Connections: Not on file    Allergies:   Allergies  Allergen Reactions   Carbamazepine Nausea Only, Rash and Other (See Comments)    Abnormal LFT's and GI Intolerance, too   Oxcarbazepine Anaphylaxis and Other (See Comments)    Patient is taking this in 2024 (??)   Phenytoin Other (See Comments)    Abnormal visual changes- "it makes me go blind"   Silicone Rash   Tape Rash    Labs:  Results for orders placed or performed during the hospital encounter  of 06/13/22 (from the past 48 hour(s))  Comprehensive metabolic panel     Status: Abnormal   Collection Time: 06/13/22  3:19 PM  Result Value Ref Range   Sodium 137 135 - 145 mmol/L    Comment: ELECTROLYTES REPEATED TO VERIFY   Potassium 3.8 3.5 - 5.1 mmol/L    Comment: ELECTROLYTES REPEATED TO VERIFY   Chloride 111 98 - 111 mmol/L    Comment: ELECTROLYTES REPEATED TO VERIFY   CO2 25 22 - 32 mmol/L    Comment: ELECTROLYTES REPEATED TO VERIFY   Glucose, Bld 83 70 - 99 mg/dL    Comment: Glucose reference range applies only to samples taken after fasting for at least 8 hours.   BUN 9 6 - 20 mg/dL   Creatinine, Ser 0.84 0.44 - 1.00 mg/dL   Calcium 8.2 (L) 8.9 - 10.3 mg/dL    Comment: ELECTROLYTES REPEATED TO VERIFY   Total Protein 6.3 (L) 6.5 - 8.1 g/dL   Albumin 3.7 3.5 - 5.0 g/dL   AST 13 (L) 15 - 41 U/L   ALT 12 0 - 44 U/L   Alkaline Phosphatase 58 38 - 126 U/L   Total Bilirubin 0.8 0.3 - 1.2 mg/dL   GFR, Estimated >60 >60 mL/min    Comment: (NOTE) Calculated using the CKD-EPI Creatinine Equation (2021)    Anion gap 1 (L) 5 - 15    Comment: ELECTROLYTES REPEATED TO VERIFY Performed at [facility] [address] [place], [place] [id number]   Salicylate level     Status: Abnormal   Collection Time: 06/13/22  3:19 PM  Result Value Ref Range   Salicylate Lvl [id number] (L) 7.0 - 30.0 mg/dL    Comment: Performed at [facility], [place] [address], [place], [place] [address]  Acetaminophen level     Status: Abnormal   Collection Time: 06/13/22  3:19 PM  Result Value Ref Range   Acetaminophen (Tylenol), Serum <10 (L) 10 - 30 ug/mL    Comment: (NOTE) Therapeutic concentrations vary significantly. A range of 10-30 ug/mL  may be an effective concentration for many patients. However, some  are best treated at concentrations outside of this range. Acetaminophen concentrations >150 ug/mL at 4 hours after ingestion  and >50 ug/mL at 12 hours after ingestion are often associated with  toxic reactions.  Performed at [facility], [place] [address], [place], [place] [address]   Ethanol     Status: None   Collection Time: 06/13/22  3:19 PM  Result Value Ref Range   Alcohol, Ethyl (B) <10 <10 mg/dL    Comment: (NOTE) Lowest detectable limit for serum alcohol is 10 mg/dL.  For medical purposes only. Performed at [facility], [place] [address] [place], [place] [address]   Urine rapid drug screen (hosp performed)     Status: Abnormal   Collection Time: 06/13/22  3:19 PM  Result Value Ref Range   Opiates NONE DETECTED NONE DETECTED   Cocaine NONE DETECTED NONE DETECTED   Benzodiazepines NONE DETECTED NONE DETECTED   Amphetamines NONE DETECTED NONE DETECTED   Tetrahydrocannabinol POSITIVE (A) NONE DETECTED   Barbiturates NONE DETECTED NONE DETECTED    Comment: (NOTE) DRUG SCREEN FOR MEDICAL PURPOSES ONLY.  IF CONFIRMATION IS NEEDED FOR ANY PURPOSE, NOTIFY LAB WITHIN 5 DAYS.  LOWEST DETECTABLE LIMITS FOR URINE DRUG SCREEN Drug Class  Cutoff (ng/mL) Amphetamine and metabolites    1000 Barbiturate and metabolites    200 Benzodiazepine                 200 Opiates and metabolites        300 Cocaine and metabolites        300 THC                            50 Performed at [facility], [place] [address], [place], [place]  [address]   CBC WITH DIFFERENTIAL     Status: Abnormal   Collection Time: 06/13/22  3:19 PM  Result Value Ref Range   WBC 3.7 (L) 4.0 - 10.5 K/uL   RBC 4.34 3.87 - 5.11 MIL/uL   Hemoglobin 12.2 12.0 - 15.0 g/dL   HCT 37.8 36.0 - 46.0 %   MCV 87.1 80.0 - 100.0 fL   MCH 28.1 26.0 - 34.0 pg   MCHC 32.3 30.0 - 36.0 g/dL   RDW 13.1 11.5 - 15.5 %   Platelets 210 150 - 400 K/uL   nRBC 0.0 0.0 - 0.2 %   Neutrophils Relative % 52 %   Neutro Abs 1.9 1.7 - 7.7 K/uL   Lymphocytes Relative 36 %   Lymphs Abs 1.3 0.7 - 4.0 K/uL   Monocytes Relative 9 %   Monocytes Absolute 0.3 0.1 - 1.0 K/uL   Eosinophils Relative 2 %   Eosinophils Absolute 0.1 0.0 - 0.5 K/uL   Basophils Relative 1 %   Basophils Absolute 0.0 0.0 - 0.1 K/uL   Immature Granulocytes 0 %   Abs Immature Granulocytes 0.00 0.00 - 0.07 K/uL    Comment: Performed at [facility], [place] [address] [place], [place] [address]  Blood gas, venous (at [facility])     Status: None   Collection Time: 06/13/22  3:19 PM  Result Value Ref Range   pH, Ven 7.37 7.25 - 7.43   pCO2, Ven 46 44 - 60 mmHg   pO2, Ven 32 32 - 45 mmHg   Bicarbonate 26.6 20.0 - 28.0 mmol/L   Acid-Base Excess 0.8 0.0 - 2.0 mmol/L   O2 Saturation 58.9 %   Patient temperature 37.0     Comment: Performed at [facility], [place] [address], [place], [place] [address]  Magnesium     Status: None   Collection Time: 06/13/22  3:19 PM  Result Value Ref Range   Magnesium 2.1 1.7 - 2.4 mg/dL    Comment: Performed at [facility], [place] [address] [place], [place] [address]  Pregnancy, urine     Status: None   Collection Time: 06/13/22  3:53 PM  Result Value Ref Range   Preg Test, Ur NEGATIVE NEGATIVE    Comment:        THE SENSITIVITY OF THIS METHODOLOGY IS >20 mIU/mL. Performed at [facility], [place] [address] [place] [address] [place],  [address]   CBG monitoring, ED     Status: None   Collection Time: 06/13/22   3:58 PM  Result Value Ref Range   Glucose-Capillary 72 70 - 99 mg/dL    Comment: Glucose reference range applies only to samples taken after fasting for at least 8 hours.  Acetaminophen level     Status: Abnormal   Collection Time: 06/13/22  5:14 PM  Result Value Ref Range   Acetaminophen (Tylenol), Serum <10 (L)  10 - 30 ug/mL    Comment: (NOTE) Therapeutic concentrations vary significantly. A range of 10-30 ug/mL  may be an effective concentration for many patients. However, some  are best treated at concentrations outside of this range. Acetaminophen concentrations >150 ug/mL at 4 hours after ingestion  and >50 ug/mL at 12 hours after ingestion are often associated with  toxic reactions.  Performed at [facility], [place] [address], [place], [place] [id number]   Basic metabolic panel     Status: Abnormal   Collection Time: 06/13/22  7:20 PM  Result Value Ref Range   Sodium 137 135 - 145 mmol/L   Potassium 3.7 3.5 - 5.1 mmol/L   Chloride 108 98 - 111 mmol/L   CO2 24 22 - 32 mmol/L   Glucose, Bld 79 70 - 99 mg/dL    Comment: Glucose reference range applies only to samples taken after fasting for at least 8 hours.   BUN 8 6 - 20 mg/dL   Creatinine, Ser 0.84 0.44 - 1.00 mg/dL   Calcium 8.0 (L) 8.9 - 10.3 mg/dL   GFR, Estimated >60 >60 mL/min    Comment: (NOTE) Calculated using the CKD-EPI Creatinine Equation (2021)    Anion gap 5 5 - 15    Comment: Performed at [facility], [place] [address], [place],  [address]    Current Facility-Administered Medications  Medication Dose Route Frequency Provider Last Rate Last Admin   famotidine (PEPCID) tablet 40 mg  40 mg Oral Daily PRN Charlesetta Shanks, MD       gabapentin (NEURONTIN) capsule 600 mg  600 mg Oral BID Charlesetta Shanks, MD   600 mg at 06/14/22 1230   Oxcarbazepine (TRILEPTAL) tablet 300 mg  300 mg Oral BID Charlesetta Shanks, MD   300 mg at 06/14/22 1230   pantoprazole (PROTONIX) EC  tablet 40 mg  40 mg Oral Daily Charlesetta Shanks, MD   40 mg at 06/14/22 1230   Current Outpatient Medications  Medication Sig Dispense Refill   dicyclomine (BENTYL) 10 MG capsule Take 10 mg by mouth daily as needed for spasms.     famotidine (PEPCID) 40 MG tablet Take 40 mg by mouth daily as needed for heartburn or indigestion.     gabapentin (NEURONTIN) 100 MG capsule Take 600 mg by mouth in the morning and at bedtime.     omeprazole (PRILOSEC) 20 MG capsule Take 40 mg by mouth See admin instructions. Take 40 mg by mouth in the morning and evening- before food     OXcarbazepine (TRILEPTAL) 150 MG tablet Take 300 mg by mouth See admin instructions. Take 300 mg by mouth in the morning and evening     Vitamin D, Ergocalciferol, (DRISDOL) 1.25 MG (50000 UNIT) CAPS capsule Take 50,000 Units by mouth every 7 (seven) days.     WEGOVY 1.7 MG/0.75ML SOAJ Inject 1.7 mg into the skin every 7 (seven) days.     metroNIDAZOLE (FLAGYL) 500 MG tablet Take 1 tablet (500 mg total) by mouth 2 (two) times daily. (Patient not taking: Reported on 06/13/2022) 14 tablet 0   metroNIDAZOLE (METROGEL VAGINAL) 0.75 % vaginal gel Place 1 Applicatorful vaginally at bedtime. Use for 5 nights. (Patient not taking: Reported on 06/13/2022) 70 g 0    Musculoskeletal: Strength & Muscle Tone: within normal limits Gait & Station: normal Patient leans: N/A   Psychiatric Specialty Exam: Presentation  General Appearance:  Appropriate for Environment  Eye Contact: Good  Speech: Clear and Coherent  Speech Volume: Normal  Handedness: Right   Mood and Affect  Mood: Anxious; Euthymic; Depressed  Affect: Appropriate; Depressed   Thought Process  Thought Processes: Coherent  Descriptions of Associations:Intact  Orientation:Full (Time, Place and Person)  Thought Content:WDL  History of Schizophrenia/Schizoaffective disorder:No data recorded Duration of Psychotic Symptoms:No data  recorded Hallucinations:Hallucinations: None  Ideas of Reference:None  Suicidal Thoughts:Suicidal Thoughts: Yes, Passive  Homicidal Thoughts:Homicidal Thoughts: No   Sensorium  Memory: Immediate Good; Recent Good; Remote Good  Judgment: Fair  Insight: Fair   Community education officer  Concentration: Fair  Attention Span: Good  Recall: Good  Fund of Knowledge: Good  Language: Good   Psychomotor Activity  Psychomotor Activity: Psychomotor Activity: Normal   Assets  Assets: Communication Skills; Desire for Improvement; Social Support; Catering manager; Housing    Sleep  Sleep: Sleep: Fair   Physical Exam: Physical Exam HENT:     Head: Normocephalic.  Eyes:     Pupils: Pupils are equal, round, and reactive to light.  Cardiovascular:     Rate and Rhythm: Normal rate.  Musculoskeletal:        General: Normal range of motion.  Neurological:     Mental Status: She is alert.  Psychiatric:        Attention and Perception: Attention normal.        Mood and Affect: Mood is depressed.        Speech: Speech normal.        Behavior: Behavior is cooperative.        Thought Content: Thought content includes suicidal ideation.        Cognition and Memory: Cognition and memory normal.        Judgment: Judgment is inappropriate.    Review of Systems  Constitutional: Negative.   HENT: Negative.    Respiratory: Negative.    Musculoskeletal: Negative.   Psychiatric/Behavioral:  Positive for depression and suicidal ideas.    Blood pressure 124/80, pulse 82, temperature 97.6 F (36.4 C), temperature source Oral, resp. rate 16, height 5\' 4"  (1.626 m), weight 83 kg, last menstrual period 05/28/2022, SpO2 99 %. Body mass index is 31.41 kg/m.   Medical Decision Making: Patient will be considered for inpatient Psychiatry care when Medically cleared.  Patient needs inpatient psychiatric admission for stabilization and treatment. Patient will be  reviewed for BMU at [facility].    Disposition: Recommend psychiatric Inpatient admission when medically cleared.  Krystle Oberman MOTLEY-MANGRUM, PMHNP 06/14/2022 1:45 PM

## 2022-06-15 DIAGNOSIS — G500 Trigeminal neuralgia: Secondary | ICD-10-CM | POA: Insufficient documentation

## 2022-06-15 DIAGNOSIS — F332 Major depressive disorder, recurrent severe without psychotic features: Secondary | ICD-10-CM

## 2022-06-15 LAB — TSH: TSH: 0.112 u[IU]/mL — ABNORMAL LOW (ref 0.350–4.500)

## 2022-06-15 LAB — LIPID PANEL
Cholesterol: 152 mg/dL (ref 0–200)
HDL: 54 mg/dL (ref >40)
LDL Cholesterol: 87 mg/dL (ref 0–99)
Total CHOL/HDL Ratio: 2.8 RATIO
Triglycerides: 55 mg/dL (ref <150)
VLDL: 11 mg/dL (ref 0–40)

## 2022-06-15 LAB — HEMOGLOBIN A1C
Hgb A1c MFr Bld: 5.4 % (ref 4.8–5.6)
Mean Plasma Glucose: 108 mg/dL

## 2022-06-15 MED ORDER — DULOXETINE HCL 30 MG PO CPEP
30.0000 mg | ORAL_CAPSULE | Freq: Every day | ORAL | Status: DC
  Administered 2022-06-15 – 2022-06-17 (×3): 30 mg via ORAL
  Filled 2022-06-15 (×3): qty 1

## 2022-06-15 NOTE — Plan of Care (Signed)
  Problem: Education: Goal: Knowledge of General Education information will improve Description: Including pain rating scale, medication(s)/side effects and non-pharmacologic comfort measures Outcome: Progressing   Problem: Activity: Goal: Risk for activity intolerance will decrease Outcome: Progressing   Problem: Nutrition: Goal: Adequate nutrition will be maintained Outcome: Progressing   Problem: Coping: Goal: Level of anxiety will decrease Outcome: Progressing   
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
## 2022-06-15 NOTE — Group Note (Signed)
Date:  06/15/2022 Time:  5:26 PM  Group Topic/Focus:  Activity Group    Participation Level:  Active  Participation Quality:  Appropriate  Affect:  Appropriate  Cognitive:  Appropriate  Insight: Appropriate  Engagement in Group:  Engaged  Modes of Intervention:  Activity  Additional Comments:    Dorena Bodo 06/15/2022, 5:26 PM

## 2022-06-15 NOTE — Group Note (Signed)
Date:  06/15/2022 Time:  11:22 PM  Group Topic/Focus:  Coping With Mental Health Crisis:   The purpose of this group is to help patients identify strategies for coping with mental health crisis.  Group discusses possible causes of crisis and ways to manage them effectively.    Participation Level:  Active  Participation Quality:  Appropriate and Attentive  Affect:  Appropriate  Cognitive:  Alert  Insight: Good  Engagement in Group:  Engaged  Modes of Intervention:  Reality Testing  Additional Comments:     Cherril Hett 06/15/2022, 11:22 PM

## 2022-06-15 NOTE — H&P (Signed)
She is followed psychiatric Admission Assessment Adult  Patient Identification: Christine Alvarado MRN:  [id number] Date of Evaluation:  06/15/2022 Chief Complaint:  Suicidal behavior [R45.89] Principal Diagnosis: Severe recurrent major depression without psychotic features ([place]) Diagnosis:  Principal Problem:   Severe recurrent major depression without psychotic features ([place]) Active Problems:   Suicidal behavior   Trigeminal neuralgia of left side of face  History of Present Illness: Patient seen and chart reviewed.  42 year old woman with a history of depression came to the emergency room after taking an impulsive overdose of multiple prescription medicines.  She says she was talking on the phone with her sister and felt overwhelmed and told her sister what she was doing then took an overdose of oxcarbazepine and gabapentin.  Patient was cooperative with workup and treatment and medically cleared.  She says she has been depressed since last June.  Major stress was that her father was ill and came from [place] to live with her and required all of her effort at caretaking.  He has gone back to [place] now but left her with the financial burden.  Additionally she finds her work very stressful.  Patient says she has been struggling with depression but had not been actively having any plans of killing herself.  Felt overwhelmed that day by an incident in which she was unfairly given parking tickets.  Patient describes poor sleep decreased energy depressed mood and feelings of hopelessness.  Denies psychotic symptoms.  Denies alcohol or drug abuse.  Patient has chronic pain issues from left-sided trigeminal neuralgia and says she still has daily pain despite the surgical treatment she has had and the medication that she takes.  Not currently on any psychiatric medicine and not seeing a therapist. Associated Signs/Symptoms: Depression Symptoms:  depressed mood, anhedonia, difficulty  concentrating, hopelessness, suicidal attempt, (Hypo) Manic Symptoms:  Impulsivity, Anxiety Symptoms:  Excessive Worry, Psychotic Symptoms:   None PTSD Symptoms: Negative Total Time spent with patient: 45 minutes  Past Psychiatric History: Patient says she has had treatment with a therapist and had depression in the past but had never tried to kill her self.  Denies any history of substance abuse.  Her drug screen is positive for THC but she denies consuming any.  Patient says she has never been prescribed an antidepressant before  Is the patient at risk to self? Yes.    Has the patient been a risk to self in the past 6 months? Yes.    Has the patient been a risk to self within the distant past? No.  Is the patient a risk to others? No.  Has the patient been a risk to others in the past 6 months? No.  Has the patient been a risk to others within the distant past? No.   [place] Scale:  [place] Admission (Current) from 06/14/2022 in [place] ED from 06/13/2022 in [facility] Emergency Department at [facility] ED from 04/26/2022 in [facility] Emergency Department at [place] No Risk High Risk No Risk        Prior Inpatient Therapy: No. If yes, describe none Prior Outpatient Therapy: No. If yes, describe none  Alcohol Screening: Patient refused Alcohol Screening Tool: Yes 1. How often do you have a drink containing alcohol?: Never 2. How many drinks containing alcohol do you have on a typical day when you are drinking?: 1 or 2 3. How often do you have six or more drinks on  one occasion?: Never AUDIT-C Score: 0 4. How often during the last year have you found that you were not able to stop drinking once you had started?: Never 5. How often during the last year have you failed to do what was normally expected from you because of drinking?: Never 6. How often during the last year have you needed a first drink in  the morning to get yourself going after a heavy drinking session?: Never 7. How often during the last year have you had a feeling of guilt of remorse after drinking?: Never 8. How often during the last year have you been unable to remember what happened the night before because you had been drinking?: Never 9. Have you or someone else been injured as a result of your drinking?: No 10. Has a relative or friend or a doctor or another health worker been concerned about your drinking or suggested you cut down?: No Alcohol Use Disorder Identification Test Final Score (AUDIT): 0 Substance Abuse History in the last 12 months:  No. Consequences of Substance Abuse: Negative Previous Psychotropic Medications: No  Psychological Evaluations: Yes  Past Medical History:  Past Medical History:  Diagnosis Date   GERD (gastroesophageal reflux disease)    Nerve disorder    Trigeminal nerve disease     Past Surgical History:  Procedure Laterality Date   BRAIN SURGERY     CARPAL TUNNEL RELEASE Right 04/28/2020   Procedure: CARPAL TUNNEL RELEASE;  Surgeon: Leanora Cover, MD;  Location: [facility];  Service: Orthopedics;  Laterality: Right;   LAPAROSCOPIC GASTRIC BAND REMOVAL WITH LAPAROSCOPIC GASTRIC SLEEVE RESECTION  01/08/2019   OTHER SURGICAL HISTORY     microvascular decompression of left side of brain   Family History:  Family History  Problem Relation Age of Onset   Breast cancer Mother 44   Hypertension Mother    Hyperlipidemia Mother    Heart disease Father    Hyperlipidemia Father    Stroke Father    Diabetes Mellitus I Sister    Breast cancer Maternal Aunt    Family Psychiatric  History: Patient says several members of her family including a sister of had depression Tobacco Screening:  Social History   Tobacco Use  Smoking Status Never  Smokeless Tobacco Never    BH Tobacco Counseling     Are you interested in Tobacco Cessation Medications?  N/A, patient does not use  tobacco products Counseled patient on smoking cessation:  N/A, patient does not use tobacco products Reason Tobacco Screening Not Completed: Patient Refused Screening       Social History:  Social History   Substance and Sexual Activity  Alcohol Use Not Currently   Comment: occ     Social History   Substance and Sexual Activity  Drug Use No    Additional Social History: Marital status: Divorced Divorced, when?: "November 2023" Additional relationship information: "the marriage started in April 2021 and ended in June 2021" Does patient have children?: Yes How many children?: 4 How is patient's relationship with their children?: Pt reports that she has 2 biological children and 2 adopted children.  "the oldest one? pretty good.  Pretty good with all of them but that youngest one who can't get her crap together"                         Allergies:   Allergies  Allergen Reactions   Carbamazepine Nausea Only, Rash and Other (See Comments)  Abnormal LFT's and GI Intolerance, too   Oxcarbazepine Anaphylaxis and Other (See Comments)    Patient is taking this in 2024 (??)   Phenytoin Other (See Comments)    Abnormal visual changes- "it makes me go blind"   Silicone Rash   Tape Rash   Lab Results:  Results for orders placed or performed during the hospital encounter of 06/14/22 (from the past 48 hour(s))  Lipid panel     Status: None   Collection Time: 06/15/22 11:40 AM  Result Value Ref Range   Cholesterol 152 0 - 200 mg/dL   Triglycerides 55 <150 mg/dL   HDL 54 >40 mg/dL   Total CHOL/HDL Ratio 2.8 RATIO   VLDL 11 0 - 40 mg/dL   LDL Cholesterol 87 0 - 99 mg/dL    Comment:        Total Cholesterol/HDL:CHD Risk Coronary Heart Disease Risk Table                     Men   Women  1/2 Average Risk   3.4   3.3  Average Risk       5.0   4.4  2 X Average Risk   9.6   7.1  3 X Average Risk  23.4   11.0        Use the calculated Patient Ratio above and the CHD Risk  Table to determine the patient's CHD Risk.        ATP III CLASSIFICATION (LDL):  <100     mg/dL   Optimal  100-129  mg/dL   Near or Above                    Optimal  130-159  mg/dL   Borderline  160-189  mg/dL   High  >190     mg/dL   Very High Performed at [facility], [address] [place], [place] [address]   TSH     Status: Abnormal   Collection Time: 06/15/22 11:40 AM  Result Value Ref Range   TSH 0.112 (L) 0.350 - 4.500 uIU/mL    Comment: Performed by a 3rd Generation assay with a functional sensitivity of <=0.01 uIU/mL. Performed at [facility], [address] [place], [place] [address]     Blood Alcohol level:  Lab Results  Component Value Date   [facility] <10 06/13/2022   ETH <10 [id number]    Metabolic Disorder Labs:  No results found for: "HGBA1C", "MPG" No results found for: "PROLACTIN" Lab Results  Component Value Date   CHOL 152 06/15/2022   TRIG 55 06/15/2022   HDL 54 06/15/2022   CHOLHDL 2.8 06/15/2022   VLDL 11 06/15/2022   LDLCALC 87 06/15/2022    Current Medications: Current Facility-Administered Medications  Medication Dose Route Frequency Provider Last Rate Last Admin   acetaminophen (TYLENOL) tablet 650 mg  650 mg Oral Q6H PRN Motley-Mangrum, Jadeka A, PMHNP       alum & mag hydroxide-simeth (MAALOX/MYLANTA) 200-200-20 MG/5ML suspension 30 mL  30 mL Oral Q4H PRN Motley-Mangrum, Jadeka A, PMHNP       diphenhydrAMINE (BENADRYL) capsule 50 mg  50 mg Oral TID PRN Motley-Mangrum, Jadeka A, PMHNP       Or   diphenhydrAMINE (BENADRYL) injection 50 mg  50 mg Intramuscular TID PRN Motley-Mangrum, Jadeka A, PMHNP       famotidine (PEPCID) tablet 40 mg  40 mg Oral Daily PRN Motley-Mangrum, Al Pimple, PMHNP  40 mg at 06/14/22 2116   gabapentin (NEURONTIN) capsule 600 mg  600 mg Oral BID Motley-Mangrum, Jadeka A, PMHNP   600 mg at 06/15/22 [id number]   haloperidol (HALDOL) tablet 5 mg  5 mg Oral TID PRN Motley-Mangrum, Donneta Romberg A, PMHNP       Or    haloperidol lactate (HALDOL) injection 5 mg  5 mg Intramuscular TID PRN Motley-Mangrum, Donneta Romberg A, PMHNP       hydrOXYzine (ATARAX) tablet 25 mg  25 mg Oral TID PRN Motley-Mangrum, Jadeka A, PMHNP       LORazepam (ATIVAN) tablet 2 mg  2 mg Oral TID PRN Motley-Mangrum, Jadeka A, PMHNP       Or   LORazepam (ATIVAN) injection 2 mg  2 mg Intramuscular TID PRN Motley-Mangrum, Jadeka A, PMHNP       magnesium hydroxide (MILK OF MAGNESIA) suspension 30 mL  30 mL Oral Daily PRN Motley-Mangrum, Jadeka A, PMHNP       Oxcarbazepine (TRILEPTAL) tablet 300 mg  300 mg Oral BID Motley-Mangrum, Jadeka A, PMHNP   300 mg at 06/15/22 0840   pantoprazole (PROTONIX) EC tablet 40 mg  40 mg Oral Daily Motley-Mangrum, Jadeka A, PMHNP   40 mg at 06/15/22 [id number]   traZODone (DESYREL) tablet 50 mg  50 mg Oral QHS PRN Motley-Mangrum, Jadeka A, PMHNP       PTA Medications: Medications Prior to Admission  Medication Sig Dispense Refill Last Dose   dicyclomine (BENTYL) 10 MG capsule Take 10 mg by mouth daily as needed for spasms.      famotidine (PEPCID) 40 MG tablet Take 40 mg by mouth daily as needed for heartburn or indigestion.      gabapentin (NEURONTIN) 100 MG capsule Take 600 mg by mouth in the morning and at bedtime.      metroNIDAZOLE (FLAGYL) 500 MG tablet Take 1 tablet (500 mg total) by mouth 2 (two) times daily. (Patient not taking: Reported on 06/13/2022) 14 tablet 0    metroNIDAZOLE (METROGEL VAGINAL) 0.75 % vaginal gel Place 1 Applicatorful vaginally at bedtime. Use for 5 nights. (Patient not taking: Reported on 06/13/2022) 70 g 0    omeprazole (PRILOSEC) 20 MG capsule Take 40 mg by mouth See admin instructions. Take 40 mg by mouth in the morning and evening- before food      OXcarbazepine (TRILEPTAL) 150 MG tablet Take 300 mg by mouth See admin instructions. Take 300 mg by mouth in the morning and evening      Vitamin D, Ergocalciferol, (DRISDOL) 1.25 MG (50000 UNIT) CAPS capsule Take 50,000 Units by mouth every 7  (seven) days.      WEGOVY 1.7 MG/0.75ML SOAJ Inject 1.7 mg into the skin every 7 (seven) days.       Musculoskeletal: Strength & Muscle Tone: within normal limits Gait & Station: normal Patient leans: N/A            Psychiatric Specialty Exam:  Presentation  General Appearance:  Appropriate for Environment  Eye Contact: Good  Speech: Clear and Coherent  Speech Volume: Normal  Handedness: Right   Mood and Affect  Mood: Anxious; Euthymic; Depressed  Affect: Appropriate; Depressed   Thought Process  Thought Processes: Coherent  Duration of Psychotic Symptoms:N/A Past Diagnosis of Schizophrenia or Psychoactive disorder: No data recorded Descriptions of Associations:Intact  Orientation:Full (Time, Place and Person)  Thought Content:WDL  Hallucinations:Hallucinations: None  Ideas of Reference:None  Suicidal Thoughts:Suicidal Thoughts: Yes, Passive  Homicidal Thoughts:Homicidal Thoughts: No   Sensorium  Memory:  Immediate Good; Recent Good; Remote Good  Judgment: Fair  Insight: Fair   Materials engineer: Fair  Attention Span: Good  Recall: Good  Fund of Knowledge: Good  Language: Good   Psychomotor Activity  Psychomotor Activity: Psychomotor Activity: Normal   Assets  Assets: Communication Skills; Desire for Improvement; Social Support; Catering manager; Housing   Sleep  Sleep: Sleep: Fair    Physical Exam: Physical Exam Vitals reviewed.  Constitutional:      Appearance: Normal appearance.  HENT:     Head: Normocephalic and atraumatic.     Mouth/Throat:     Pharynx: Oropharynx is clear.  Eyes:     Pupils: Pupils are equal, round, and reactive to light.  Cardiovascular:     Rate and Rhythm: Normal rate and regular rhythm.  Pulmonary:     Effort: Pulmonary effort is normal.     Breath sounds: Normal breath sounds.  Abdominal:     General: Abdomen is flat.     Palpations:  Abdomen is soft.  Musculoskeletal:        General: Normal range of motion.  Skin:    General: Skin is warm and dry.  Neurological:     General: No focal deficit present.     Mental Status: She is alert. Mental status is at baseline.  Psychiatric:        Mood and Affect: Mood normal.        Thought Content: Thought content normal.    Review of Systems  Constitutional: Negative.   HENT: Negative.    Eyes: Negative.   Respiratory: Negative.    Cardiovascular: Negative.   Gastrointestinal: Negative.   Musculoskeletal: Negative.   Skin: Negative.   Neurological: Negative.   Psychiatric/Behavioral:  Positive for depression.    Blood pressure 108/76, pulse 95, temperature 98.8 F (37.1 C), temperature source Oral, resp. rate 19, height 5\' 4"  (1.626 m), weight 81.6 kg, last menstrual period 05/28/2022, SpO2 98 %. Body mass index is 30.9 kg/m.  Treatment Plan Summary: Daily contact with patient to assess and evaluate symptoms and progress in treatment, Medication management, and Plan patient with depression and chronic pain not currently on any medicine for depression.  Suicide attempt with large overdose but then told someone about it and was cooperative with treatment.  Denies active intent to harm her self now.  Not psychotic.  Discussed options for treatment with her.  Strongly encouraged her to go back to seeing a therapist at discharge.  Additionally I proposed that we had some antidepressant medicine especially something that can maybe help with the pain as well.  Patient was agreeable to adding duloxetine 30 mg a day initially to her current medicine.  Engage in individual and group therapy.  She met with treatment team today.  Ongoing assessment of dangerousness before discharge.  Observation Level/Precautions:  15 minute checks  Laboratory:  Chemistry Profile  Psychotherapy:    Medications:    Consultations:    Discharge Concerns:    Estimated LOS:  Other:     Physician  Treatment Plan for Primary Diagnosis: Severe recurrent major depression without psychotic features ([place]) Long Term Goal(s): Improvement in symptoms so as ready for discharge  Short Term Goals: Ability to verbalize feelings will improve and Ability to disclose and discuss suicidal ideas  Physician Treatment Plan for Secondary Diagnosis: Principal Problem:   Severe recurrent major depression without psychotic features ([place]) Active Problems:   Suicidal behavior   Trigeminal neuralgia of left side of face  Long Term Goal(s): Improvement in symptoms so as ready for discharge  Short Term Goals: Ability to maintain clinical measurements within normal limits will improve and Compliance with prescribed medications will improve  I certify that inpatient services furnished can reasonably be expected to improve the patient's condition.    Alethia Berthold, MD 3/20/20244:26 PM

## 2022-06-15 NOTE — BH IP Treatment Plan (Signed)
Interdisciplinary Treatment and Diagnostic Plan Update  06/15/2022 Time of Session: 0830 Christine Alvarado MRN: [id number]  Principal Diagnosis: Suicidal behavior  Secondary Diagnoses: Principal Problem:   Suicidal behavior   Current Medications:  Current Facility-Administered Medications  Medication Dose Route Frequency Provider Last Rate Last Admin   acetaminophen (TYLENOL) tablet 650 mg  650 mg Oral Q6H PRN Motley-Mangrum, Jadeka A, PMHNP       alum & mag hydroxide-simeth (MAALOX/MYLANTA) 200-200-20 MG/5ML suspension 30 mL  30 mL Oral Q4H PRN Motley-Mangrum, Jadeka A, PMHNP       diphenhydrAMINE (BENADRYL) capsule 50 mg  50 mg Oral TID PRN Motley-Mangrum, Jadeka A, PMHNP       Or   diphenhydrAMINE (BENADRYL) injection 50 mg  50 mg Intramuscular TID PRN Motley-Mangrum, Jadeka A, PMHNP       famotidine (PEPCID) tablet 40 mg  40 mg Oral Daily PRN Motley-Mangrum, Jadeka A, PMHNP   40 mg at 06/14/22 2116   gabapentin (NEURONTIN) capsule 600 mg  600 mg Oral BID Motley-Mangrum, Jadeka A, PMHNP   600 mg at 06/15/22 [id number]   haloperidol (HALDOL) tablet 5 mg  5 mg Oral TID PRN Motley-Mangrum, Jadeka A, PMHNP       Or   haloperidol lactate (HALDOL) injection 5 mg  5 mg Intramuscular TID PRN Motley-Mangrum, Donneta Romberg A, PMHNP       hydrOXYzine (ATARAX) tablet 25 mg  25 mg Oral TID PRN Motley-Mangrum, Jadeka A, PMHNP       LORazepam (ATIVAN) tablet 2 mg  2 mg Oral TID PRN Motley-Mangrum, Jadeka A, PMHNP       Or   LORazepam (ATIVAN) injection 2 mg  2 mg Intramuscular TID PRN Motley-Mangrum, Jadeka A, PMHNP       magnesium hydroxide (MILK OF MAGNESIA) suspension 30 mL  30 mL Oral Daily PRN Motley-Mangrum, Jadeka A, PMHNP       Oxcarbazepine (TRILEPTAL) tablet 300 mg  300 mg Oral BID Motley-Mangrum, Jadeka A, PMHNP   300 mg at 06/15/22 0840   pantoprazole (PROTONIX) EC tablet 40 mg  40 mg Oral Daily Motley-Mangrum, Jadeka A, PMHNP   40 mg at 06/15/22 0838   traZODone (DESYREL) tablet 50 mg  50 mg Oral QHS  PRN Motley-Mangrum, Jadeka A, PMHNP       PTA Medications: Medications Prior to Admission  Medication Sig Dispense Refill Last Dose   dicyclomine (BENTYL) 10 MG capsule Take 10 mg by mouth daily as needed for spasms.      famotidine (PEPCID) 40 MG tablet Take 40 mg by mouth daily as needed for heartburn or indigestion.      gabapentin (NEURONTIN) 100 MG capsule Take 600 mg by mouth in the morning and at bedtime.      metroNIDAZOLE (FLAGYL) 500 MG tablet Take 1 tablet (500 mg total) by mouth 2 (two) times daily. (Patient not taking: Reported on 06/13/2022) 14 tablet 0    metroNIDAZOLE (METROGEL VAGINAL) 0.75 % vaginal gel Place 1 Applicatorful vaginally at bedtime. Use for 5 nights. (Patient not taking: Reported on 06/13/2022) 70 g 0    omeprazole (PRILOSEC) 20 MG capsule Take 40 mg by mouth See admin instructions. Take 40 mg by mouth in the morning and evening- before food      OXcarbazepine (TRILEPTAL) 150 MG tablet Take 300 mg by mouth See admin instructions. Take 300 mg by mouth in the morning and evening      Vitamin D, Ergocalciferol, (DRISDOL) 1.25 MG (50000 UNIT) CAPS capsule Take  50,000 Units by mouth every 7 (seven) days.      WEGOVY 1.7 MG/0.75ML SOAJ Inject 1.7 mg into the skin every 7 (seven) days.       Patient Stressors: Financial difficulties   Marital or family conflict    Patient Strengths: Motivation for treatment/growth   Treatment Modalities: Medication Management, Group therapy, Case management,  1 to 1 session with clinician, Psychoeducation, Recreational therapy.   Physician Treatment Plan for Primary Diagnosis: Suicidal behavior Long Term Goal(s):     Short Term Goals:    Medication Management: Evaluate patient's response, side effects, and tolerance of medication regimen.  Therapeutic Interventions: 1 to 1 sessions, Unit Group sessions and Medication administration.  Evaluation of Outcomes: Progressing  Physician Treatment Plan for Secondary Diagnosis:  Principal Problem:   Suicidal behavior  Long Term Goal(s):     Short Term Goals:       Medication Management: Evaluate patient's response, side effects, and tolerance of medication regimen.  Therapeutic Interventions: 1 to 1 sessions, Unit Group sessions and Medication administration.  Evaluation of Outcomes: Progressing   RN Treatment Plan for Primary Diagnosis: Suicidal behavior Long Term Goal(s): Knowledge of disease and therapeutic regimen to maintain health will improve  Short Term Goals: Ability to remain free from injury will improve, Ability to verbalize frustration and anger appropriately will improve, Ability to demonstrate self-control, Ability to participate in decision making will improve, Ability to verbalize feelings will improve, Ability to disclose and discuss suicidal ideas, Ability to identify and develop effective coping behaviors will improve, and Compliance with prescribed medications will improve  Medication Management: RN will administer medications as ordered by provider, will assess and evaluate patient's response and provide education to patient for prescribed medication. RN will report any adverse and/or side effects to prescribing provider.  Therapeutic Interventions: 1 on 1 counseling sessions, Psychoeducation, Medication administration, Evaluate responses to treatment, Monitor vital signs and CBGs as ordered, Perform/monitor CIWA, COWS, AIMS and Fall Risk screenings as ordered, Perform wound care treatments as ordered.  Evaluation of Outcomes: Progressing   LCSW Treatment Plan for Primary Diagnosis: Suicidal behavior Long Term Goal(s): Safe transition to appropriate next level of care at discharge, Engage patient in therapeutic group addressing interpersonal concerns.  Short Term Goals: Engage patient in aftercare planning with referrals and resources, Increase social support, Increase ability to appropriately verbalize feelings, Increase emotional  regulation, Facilitate acceptance of mental health diagnosis and concerns, Facilitate patient progression through stages of change regarding substance use diagnoses and concerns, Identify triggers associated with mental health/substance abuse issues, and Increase skills for wellness and recovery  Therapeutic Interventions: Assess for all discharge needs, 1 to 1 time with Social worker, Explore available resources and support systems, Assess for adequacy in community support network, Educate family and significant other(s) on suicide prevention, Complete Psychosocial Assessment, Interpersonal group therapy.  Evaluation of Outcomes: Progressing   Progress in Treatment: Attending groups: No. Participating in groups: No. Taking medication as prescribed: Yes. Toleration medication: Yes. Family/Significant other contact made: No, will contact:  CSW to obtain consent from patient  Patient understands diagnosis: No. Discussing patient identified problems/goals with staff: Yes. Medical problems stabilized or resolved: Yes. Denies suicidal/homicidal ideation: Yes. Issues/concerns per patient self-inventory: Yes. Other: none  New problem(s) identified: No, Describe:  none  New Short Term/Long Term Goal(s): Patient to work towards medication management for mood stabilization; elimination of SI thoughts; development of comprehensive mental wellness plan.   Patient Goals: Patient states their goal for treatment is to "learn  to deal with life and stand up for myself."  Discharge Plan or Barriers: No psychosocial barriers identified at this time, patient to return to place of residence when appropriate for discharge.   Reason for Continuation of Hospitalization: Depression Medication stabilization  Estimated Length of Stay: 1-7 days  Last 3 [place] Suicide Severity Risk Score: Flowsheet Row Admission (Current) from 06/14/2022 in [place] ED from 06/13/2022 in [facility]  Emergency Department at [facility] ED from 04/26/2022 in [facility] Department at [place] No Risk High Risk No Risk       Scribe for Treatment Team: Larose Kells 06/15/2022 10:13 AM

## 2022-06-15 NOTE — Group Note (Signed)
Recreation Therapy Group Note   Group Topic:Problem Solving  Group Date: 06/15/2022 Start Time: 1000 End Time: 1055 Facilitators: Vilma Prader, LRT, CTRS Location:  Craft Room  Group Description: Life Boat. Patients were given the scenario that they are on a boat that is about to become shipwrecked, leaving them stranded on an [place]. They are asked to make a list of 15 different items that they want to take with them when they are stranded on the [place]. Patients are asked to rank their items from most important to least important, #1 being the most important and #15 being the least. Patients will work individually for the first round to come up with 15 items and then pair up with a peer(s) to condense their list and come up with one list of 15 items between the two of them. Patients or LRT will read aloud the 15 different items to the group after each round. LRT facilitated post-activity processing to discuss how this activity can be used in daily life post discharge.    Affect/Mood: Appropriate, Congruent, and Happy   Participation Level: Active and Engaged   Participation Quality: Independent   Behavior: Alert, Appropriate, Attentive , and Cooperative   Speech/Thought Process: Coherent   Insight: Good   Judgement: Good   Modes of Intervention: Activity and Guided Discussion   Patient Response to Interventions:  Attentive, Engaged, Interested , and Receptive   Education Outcome:  Acknowledges education   Clinical Observations/Individualized Feedback: Latonya was active in their participation of session activities and group discussion. Pt identified "medicine, clean drinking water and my sister" as some of her items on the list. Pt had #1 as medicine and provided good insight as to why she chose this along with the other items. Pt was bright and interacted well with peers and LRT duration of session.   Plan: Continue to engage patient in RT group sessions 2-3x/week.   Vilma Prader, LRT, CTRS 06/15/2022 11:08 AM

## 2022-06-15 NOTE — Group Note (Signed)
[facility] LCSW Group Therapy Note   Group Date: 06/15/2022 Start Time: 1300 End Time: 1400   Type of Therapy/Topic:  Group Therapy:  Emotion Regulation  Participation Level:  Active   Mood:  Description of Group:    The purpose of this group is to assist patients in learning to regulate negative emotions and experience positive emotions. Patients will be guided to discuss ways in which they have been vulnerable to their negative emotions. These vulnerabilities will be juxtaposed with experiences of positive emotions or situations, and patients challenged to use positive emotions to combat negative ones. Special emphasis will be placed on coping with negative emotions in conflict situations, and patients will process healthy conflict resolution skills.  Therapeutic Goals: Patient will identify two positive emotions or experiences to reflect on in order to balance out negative emotions:  Patient will label two or more emotions that they find the most difficult to experience:  Patient will be able to demonstrate positive conflict resolution skills through discussion or role plays:   Summary of Patient Progress: Patient was present in group.  Patient was an active participant.  Patient was supportive of others.  Patient was able to engage in discussion and displayed fair insight. Patient was appropriate.  Patient was able to identify coping skills that she currently uses and ones that she can begin.  Therapeutic Modalities:   Cognitive Behavioral Therapy Feelings Identification Dialectical Behavioral Therapy   Rozann Lesches, LCSW

## 2022-06-15 NOTE — BHH Suicide Risk Assessment (Signed)
[facility] INPATIENT:  Family/Significant Other Suicide Prevention Education  Suicide Prevention Education:  Patient Refusal for Family/Significant Other Suicide Prevention Education: The patient Christine Alvarado has refused to provide written consent for family/significant other to be provided Family/Significant Other Suicide Prevention Education during admission and/or prior to discharge.  Physician notified.  SPE completed with pt, as pt refused to consent to family contact. SPI pamphlet provided to pt and pt was encouraged to share information with support network, ask questions, and talk about any concerns relating to SPE. Pt denies access to guns/firearms and verbalized understanding of information provided. Mobile Crisis information also provided to pt.   Rozann Lesches 06/15/2022, 3:31 PM

## 2022-06-15 NOTE — Progress Notes (Signed)
Pt calm and cooperative; denies SI HI AVH, appropriately social; anxious.  Pt was compliant with medications.  Continued monitoring for safety.  06/15/22 0600  Psych Admission Type (Psych Patients Only)  Admission Status Involuntary  Psychosocial Assessment  Patient Complaints Anxiety  Eye Contact Fair  Facial Expression Flat  Affect Anxious  Speech Soft  Interaction Assertive  Motor Activity Slow  Appearance/Hygiene Unremarkable  Behavior Characteristics Cooperative  Mood Anxious  Thought Process  Coherency WDL  Content Preoccupation  Delusions None reported or observed  Perception WDL  Hallucination None reported or observed  Judgment WDL  Confusion None  Danger to Self  Current suicidal ideation? Denies  Danger to Others  Danger to Others None reported or observed

## 2022-06-15 NOTE — BHH Suicide Risk Assessment (Signed)
[facility] Admission Suicide Risk Assessment   Nursing information obtained from:  Patient Demographic factors:  NA Current Mental Status:  NA Loss Factors:  Financial problems / change in socioeconomic status Historical Factors:  Prior suicide attempts (15 years ago) Risk Reduction Factors:  Positive social support  Total Time spent with patient: 45 minutes Principal Problem: Severe recurrent major depression without psychotic features ([place]) Diagnosis:  Principal Problem:   Severe recurrent major depression without psychotic features ([place]) Active Problems:   Suicidal behavior   Trigeminal neuralgia of left side of face  Subjective Data: Patient seen.  42 year old woman brought to the emergency room after taking an impulsive overdose of multiple prescription medicine with suicidal thoughts.  Cooperative with intake and treatment.  Currently endorses symptoms of depression but does not endorse active suicidal intent or plan.  No evidence of psychosis.  Patient is cooperative with treatment planning.  Continued Clinical Symptoms:  Alcohol Use Disorder Identification Test Final Score (AUDIT): 0 The "Alcohol Use Disorders Identification Test", Guidelines for Use in Primary Care, Second Edition.  World Pharmacologist [facility]). Score between 0-7:  no or low risk or alcohol related problems. Score between 8-15:  moderate risk of alcohol related problems. Score between 16-19:  high risk of alcohol related problems. Score 20 or above:  warrants further diagnostic evaluation for alcohol dependence and treatment.   CLINICAL FACTORS:   Depression:   Impulsivity Medical Diagnoses and Treatments/Surgeries   Musculoskeletal: Strength & Muscle Tone: within normal limits Gait & Station: normal Patient leans: N/A  Psychiatric Specialty Exam:  Presentation  General Appearance:  Appropriate for Environment  Eye Contact: Good  Speech: Clear and Coherent  Speech  Volume: Normal  Handedness: Right   Mood and Affect  Mood: Anxious; Euthymic; Depressed  Affect: Appropriate; Depressed   Thought Process  Thought Processes: Coherent  Descriptions of Associations:Intact  Orientation:Full (Time, Place and Person)  Thought Content:WDL  History of Schizophrenia/Schizoaffective disorder:No data recorded Duration of Psychotic Symptoms:No data recorded Hallucinations:Hallucinations: None  Ideas of Reference:None  Suicidal Thoughts:Suicidal Thoughts: Yes, Passive  Homicidal Thoughts:Homicidal Thoughts: No   Sensorium  Memory: Immediate Good; Recent Good; Remote Good  Judgment: Fair  Insight: Fair   Community education officer  Concentration: Fair  Attention Span: Good  Recall: Good  Fund of Knowledge: Good  Language: Good   Psychomotor Activity  Psychomotor Activity: Psychomotor Activity: Normal   Assets  Assets: Communication Skills; Desire for Improvement; Social Support; Catering manager; Housing   Sleep  Sleep: Sleep: Fair    Physical Exam: Physical Exam Vitals and nursing note reviewed.  Constitutional:      Appearance: Normal appearance.  HENT:     Head: Normocephalic and atraumatic.     Mouth/Throat:     Pharynx: Oropharynx is clear.  Eyes:     Pupils: Pupils are equal, round, and reactive to light.  Cardiovascular:     Rate and Rhythm: Normal rate and regular rhythm.  Pulmonary:     Effort: Pulmonary effort is normal.     Breath sounds: Normal breath sounds.  Abdominal:     General: Abdomen is flat.     Palpations: Abdomen is soft.  Musculoskeletal:        General: Normal range of motion.  Skin:    General: Skin is warm and dry.  Neurological:     General: No focal deficit present.     Mental Status: She is alert. Mental status is at baseline.  Psychiatric:  Attention and Perception: Attention normal.        Mood and Affect: Mood is depressed.        Speech:  Speech normal.        Behavior: Behavior is cooperative.        Thought Content: Thought content normal.        Cognition and Memory: Cognition normal.        Judgment: Judgment normal.    Review of Systems  Constitutional: Negative.   HENT: Negative.    Eyes: Negative.   Respiratory: Negative.    Cardiovascular: Negative.   Gastrointestinal: Negative.   Musculoskeletal: Negative.   Skin: Negative.   Neurological: Negative.   Psychiatric/Behavioral:  Positive for depression. Negative for hallucinations. The patient is nervous/anxious. The patient does not have insomnia.    Blood pressure 108/76, pulse 95, temperature 98.8 F (37.1 C), temperature source Oral, resp. rate 19, height 5\' 4"  (1.626 m), weight 81.6 kg, last menstrual period 05/28/2022, SpO2 98 %. Body mass index is 30.9 kg/m.   COGNITIVE FEATURES THAT CONTRIBUTE TO RISK:  Thought constriction (tunnel vision)    SUICIDE RISK:   Minimal: No identifiable suicidal ideation.  Patients presenting with no risk factors but with morbid ruminations; may be classified as minimal risk based on the severity of the depressive symptoms  PLAN OF CARE: Continue 15-minute checks.  Ongoing assessment of dangerousness prior to discharge.  Treat depression and medical issues.  Work on outpatient referral.  I certify that inpatient services furnished can reasonably be expected to improve the patient's condition.   Alethia Berthold, MD 06/15/2022, 4:17 PM

## 2022-06-15 NOTE — Group Note (Signed)
Date:  06/15/2022 Time:  10:25 AM  Group Topic/Focus:  Community Meeting    Participation Level:  Active  Participation Quality:  Appropriate  Affect:  Appropriate  Cognitive:  Appropriate  Insight: Appropriate  Engagement in Group:  Engaged  Modes of Intervention:  Discussion, Education, and Support  Additional Comments:    Dorena Bodo 06/15/2022, 10:25 AM

## 2022-06-15 NOTE — Progress Notes (Signed)
Pt denies SI/HI/AVH and verbally agrees to approach staff if these become apparent or before harming themselves/others. Rates depression 0/10. Rates anxiety 0/10. Rates pain 0/10.  Pt is very pleasant and has not had any concerns or issues. Scheduled medications administered to pt, per MD orders. RN provided support and encouragement to pt. Q15 min safety checks implemented and continued. Pt is safe on the unit. Plan of care on going and no other concerns expressed at this time.  06/15/22 0838  Psych Admission Type (Psych Patients Only)  Admission Status Involuntary  Psychosocial Assessment  Patient Complaints None  Eye Contact Fair  Facial Expression Sad  Affect Appropriate to circumstance  Speech Soft;Logical/coherent  Interaction Assertive  Motor Activity Slow  Appearance/Hygiene Unremarkable  Behavior Characteristics Cooperative;Appropriate to situation;Calm  Mood Sad;Pleasant  Aggressive Behavior  Effect No apparent injury  Thought Process  Coherency WDL  Content WDL  Delusions None reported or observed  Perception WDL  Hallucination None reported or observed  Judgment Impaired  Confusion None  Danger to Self  Current suicidal ideation? Denies  Danger to Others  Danger to Others None reported or observed

## 2022-06-16 DIAGNOSIS — F332 Major depressive disorder, recurrent severe without psychotic features: Secondary | ICD-10-CM | POA: Diagnosis not present

## 2022-06-16 NOTE — BHH Group Notes (Signed)
[facility] Group Notes:  (Nursing/MHT/Case Management/Adjunct)  Date:  06/16/2022  Time:  8:28 PM  Type of Therapy:   Wrap up  Participation Level:  Active  Participation Quality:  Appropriate  Affect:  Appropriate  Cognitive:  Alert  Insight:  Good  Engagement in Group: She said she did meet her goal.  Modes of Intervention:  Support  Summary of Progress/Problems:  Christine Alvarado 06/16/2022, 8:28 PM

## 2022-06-16 NOTE — Group Note (Signed)
LCSW Group Therapy Note  Group Date: 06/16/2022 Start Time: 1300 End Time: 1400   Type of Therapy and Topic:  Group Therapy - Healthy vs Unhealthy Coping Skills  Participation Level:  Active   Description of Group The focus of this group was to determine what unhealthy coping techniques typically are used by group members and what healthy coping techniques would be helpful in coping with various problems. Patients were guided in becoming aware of the differences between healthy and unhealthy coping techniques. Patients were asked to identify 2-3 healthy coping skills they would like to learn to use more effectively.  Therapeutic Goals Patients learned that coping is what human beings do all day long to deal with various situations in their lives Patients defined and discussed healthy vs unhealthy coping techniques Patients identified their preferred coping techniques and identified whether these were healthy or unhealthy Patients determined 2-3 healthy coping skills they would like to become more familiar with and use more often. Patients provided support and ideas to each other   Summary of Patient Progress:   Patient was present for the entirety of the group session. Patient was an active listener and participated in the topic of discussion, provided helpful advice to others, and added nuance to topic of conversation. Patient assisted other patients in reframing their thoughts in a more positive manner.   Therapeutic Modalities Cognitive Behavioral Therapy Motivational Interviewing  Larose Kells 06/16/2022  2:23 PM

## 2022-06-16 NOTE — Progress Notes (Signed)
[facility] MD Progress Note  06/16/2022 12:50 PM Christine Alvarado  MRN:  [id number] Subjective: Follow-up 42 year old woman with depression.  Patient seen and chart reviewed.  Neatly dressed woman cooperative and pleasant during the conversation.  She describes her self as just feeling stable.  Denies acute suicidal intent.  Still feeling depressed.  Took the Cymbalta last night and today.  Describes herself as feeling a little jittery from it and did not sleep as well last night.  No evidence of psychosis no behavior problems. Principal Problem: Severe recurrent major depression without psychotic features ([place]) Diagnosis: Principal Problem:   Severe recurrent major depression without psychotic features ([place]) Active Problems:   Suicidal behavior   Trigeminal neuralgia of left side of face  Total Time spent with patient: 30 minutes  Past Psychiatric History: Past history of depression  Past Medical History:  Past Medical History:  Diagnosis Date   GERD (gastroesophageal reflux disease)    Nerve disorder    Trigeminal nerve disease     Past Surgical History:  Procedure Laterality Date   BRAIN SURGERY     CARPAL TUNNEL RELEASE Right 04/28/2020   Procedure: CARPAL TUNNEL RELEASE;  Surgeon: Leanora Cover, MD;  Location: [place];  Service: Orthopedics;  Laterality: Right;   LAPAROSCOPIC GASTRIC BAND REMOVAL WITH LAPAROSCOPIC GASTRIC SLEEVE RESECTION  01/08/2019   OTHER SURGICAL HISTORY     microvascular decompression of left side of brain   Family History:  Family History  Problem Relation Age of Onset   Breast cancer Mother 60   Hypertension Mother    Hyperlipidemia Mother    Heart disease Father    Hyperlipidemia Father    Stroke Father    Diabetes Mellitus I Sister    Breast cancer Maternal Aunt    Family Psychiatric  History: See previous Social History:  Social History   Substance and Sexual Activity  Alcohol Use Not Currently   Comment: occ     Social History    Substance and Sexual Activity  Drug Use No    Social History   Socioeconomic History   Marital status: Single    Spouse name: Not on file   Number of children: Not on file   Years of education: Not on file   Highest education level: Not on file  Occupational History   Not on file  Tobacco Use   Smoking status: Never   Smokeless tobacco: Never  Vaping Use   Vaping Use: Never used  Substance and Sexual Activity   Alcohol use: Not Currently    Comment: occ   Drug use: No   Sexual activity: Not on file  Other Topics Concern   Not on file  Social History Narrative   Not on file   Social Determinants of Health   Financial Resource Strain: Not on file  Food Insecurity: Patient Declined (06/14/2022)   Hunger Vital Sign    Worried About Running Out of Food in the Last Year: Patient declined    Ran Out of Food in the Last Year: Patient declined  Transportation Needs: No Transportation Needs (06/14/2022)   PRAPARE - Hydrologist (Medical): No    Lack of Transportation (Non-Medical): No  Physical Activity: Not on file  Stress: Not on file  Social Connections: Not on file   Additional Social History:  Sleep: Poor  Appetite:  Fair  Current Medications: Current Facility-Administered Medications  Medication Dose Route Frequency Provider Last Rate Last Admin   acetaminophen (TYLENOL) tablet 650 mg  650 mg Oral Q6H PRN Motley-Mangrum, Jadeka A, PMHNP       alum & mag hydroxide-simeth (MAALOX/MYLANTA) 200-200-20 MG/5ML suspension 30 mL  30 mL Oral Q4H PRN Motley-Mangrum, Jadeka A, PMHNP       diphenhydrAMINE (BENADRYL) capsule 50 mg  50 mg Oral TID PRN Motley-Mangrum, Jadeka A, PMHNP       Or   diphenhydrAMINE (BENADRYL) injection 50 mg  50 mg Intramuscular TID PRN Motley-Mangrum, Jadeka A, PMHNP       DULoxetine (CYMBALTA) DR capsule 30 mg  30 mg Oral Daily Maguire Sime T, MD   30 mg at 06/16/22 0836   famotidine  (PEPCID) tablet 40 mg  40 mg Oral Daily PRN Motley-Mangrum, Jadeka A, PMHNP   40 mg at 06/16/22 0837   gabapentin (NEURONTIN) capsule 600 mg  600 mg Oral BID Motley-Mangrum, Jadeka A, PMHNP   600 mg at 06/16/22 0836   haloperidol (HALDOL) tablet 5 mg  5 mg Oral TID PRN Motley-Mangrum, Jadeka A, PMHNP       Or   haloperidol lactate (HALDOL) injection 5 mg  5 mg Intramuscular TID PRN Motley-Mangrum, Donneta Romberg A, PMHNP       hydrOXYzine (ATARAX) tablet 25 mg  25 mg Oral TID PRN Motley-Mangrum, Jadeka A, PMHNP       LORazepam (ATIVAN) tablet 2 mg  2 mg Oral TID PRN Motley-Mangrum, Jadeka A, PMHNP       Or   LORazepam (ATIVAN) injection 2 mg  2 mg Intramuscular TID PRN Motley-Mangrum, Jadeka A, PMHNP       magnesium hydroxide (MILK OF MAGNESIA) suspension 30 mL  30 mL Oral Daily PRN Motley-Mangrum, Jadeka A, PMHNP       Oxcarbazepine (TRILEPTAL) tablet 300 mg  300 mg Oral BID Motley-Mangrum, Jadeka A, PMHNP   300 mg at 06/16/22 0836   pantoprazole (PROTONIX) EC tablet 40 mg  40 mg Oral Daily Motley-Mangrum, Jadeka A, PMHNP   40 mg at 06/15/22 [id number]   traZODone (DESYREL) tablet 50 mg  50 mg Oral QHS PRN Motley-Mangrum, Al Pimple, PMHNP        Lab Results:  Results for orders placed or performed during the hospital encounter of 06/14/22 (from the past 48 hour(s))  Hemoglobin A1c     Status: None   Collection Time: 06/15/22 11:40 AM  Result Value Ref Range   Hgb A1c MFr Bld 5.4 4.8 - 5.6 %    Comment: (NOTE)         Prediabetes: 5.7 - 6.4         Diabetes: >6.4         Glycemic control for adults with diabetes: <7.0    Mean Plasma Glucose 108 mg/dL    Comment: (NOTE) Performed At: [facility] [address] [place], [place] [id number] Rush Farmer MD [id number]   Lipid panel     Status: None   Collection Time: 06/15/22 11:40 AM  Result Value Ref Range   Cholesterol 152 0 - 200 mg/dL   Triglycerides 55 <150 mg/dL   HDL 54 >40 mg/dL   Total CHOL/HDL Ratio 2.8 RATIO   VLDL 11 0 -  40 mg/dL   LDL Cholesterol 87 0 - 99 mg/dL    Comment:        Total Cholesterol/HDL:CHD Risk Coronary Heart Disease Risk  Table                     Men   Women  1/2 Average Risk   3.4   3.3  Average Risk       5.0   4.4  2 X Average Risk   9.6   7.1  3 X Average Risk  23.4   11.0        Use the calculated Patient Ratio above and the CHD Risk Table to determine the patient's CHD Risk.        ATP III CLASSIFICATION (LDL):  <100     mg/dL   Optimal  100-129  mg/dL   Near or Above                    Optimal  130-159  mg/dL   Borderline  160-189  mg/dL   High  >190     mg/dL   Very High Performed at [facility], [address] [place], [place] [address]   TSH     Status: Abnormal   Collection Time: 06/15/22 11:40 AM  Result Value Ref Range   TSH 0.112 (L) 0.350 - 4.500 uIU/mL    Comment: Performed by a 3rd Generation assay with a functional sensitivity of <=0.01 uIU/mL. Performed at [facility], [address] [place], [place] [address]     Blood Alcohol level:  Lab Results  Component Value Date   [facility] <10 06/13/2022   ETH <10 [id number]    Metabolic Disorder Labs: Lab Results  Component Value Date   HGBA1C 5.4 06/15/2022   MPG 108 06/15/2022   No results found for: "PROLACTIN" Lab Results  Component Value Date   CHOL 152 06/15/2022   TRIG 55 06/15/2022   HDL 54 06/15/2022   CHOLHDL 2.8 06/15/2022   VLDL 11 06/15/2022   LDLCALC 87 06/15/2022    Physical Findings: AIMS: Facial and Oral Movements Muscles of Facial Expression: None, normal Lips and Perioral Area: None, normal Jaw: None, normal Tongue: None, normal,Extremity Movements Upper (arms, wrists, hands, fingers): None, normal Lower (legs, knees, ankles, toes): None, normal, Trunk Movements Neck, shoulders, hips: None, normal, Overall Severity Severity of abnormal movements (highest score from questions above): None, normal Incapacitation due to abnormal movements: None,  normal Patient's awareness of abnormal movements (rate only patient's report): No Awareness, Dental Status Current problems with teeth and/or dentures?: No Does patient usually wear dentures?: No  CIWA:    COWS:     Musculoskeletal: Strength & Muscle Tone: within normal limits Gait & Station: normal Patient leans: N/A  Psychiatric Specialty Exam:  Presentation  General Appearance:  Appropriate for Environment  Eye Contact: Good  Speech: Clear and Coherent  Speech Volume: Normal  Handedness: Right   Mood and Affect  Mood: Anxious; Euthymic; Depressed  Affect: Appropriate; Depressed   Thought Process  Thought Processes: Coherent  Descriptions of Associations:Intact  Orientation:Full (Time, Place and Person)  Thought Content:WDL  History of Schizophrenia/Schizoaffective disorder:No data recorded Duration of Psychotic Symptoms:No data recorded Hallucinations:No data recorded Ideas of Reference:None  Suicidal Thoughts:No data recorded Homicidal Thoughts:No data recorded  Sensorium  Memory: Immediate Good; Recent Good; Remote Good  Judgment: Fair  Insight: Fair   Community education officer  Concentration: Fair  Attention Span: Good  Recall: Good  Fund of Knowledge: Good  Language: Good   Psychomotor Activity  Psychomotor Activity:No data recorded  Assets  Assets: Communication Skills; Desire for Improvement; Social Support;  Financial Resources/Insurance; Housing   Sleep  Sleep:No data recorded   Physical Exam: Physical Exam Vitals and nursing note reviewed.  Constitutional:      Appearance: Normal appearance.  HENT:     Head: Normocephalic and atraumatic.     Mouth/Throat:     Pharynx: Oropharynx is clear.  Eyes:     Pupils: Pupils are equal, round, and reactive to light.  Cardiovascular:     Rate and Rhythm: Normal rate and regular rhythm.  Pulmonary:     Effort: Pulmonary effort is normal.     Breath sounds: Normal  breath sounds.  Abdominal:     General: Abdomen is flat.     Palpations: Abdomen is soft.  Musculoskeletal:        General: Normal range of motion.  Skin:    General: Skin is warm and dry.  Neurological:     General: No focal deficit present.     Mental Status: She is alert. Mental status is at baseline.  Psychiatric:        Attention and Perception: Attention normal.        Mood and Affect: Mood normal.        Speech: Speech normal.        Behavior: Behavior normal.        Thought Content: Thought content normal.        Cognition and Memory: Cognition normal.    Review of Systems  Constitutional: Negative.   HENT: Negative.    Eyes: Negative.   Respiratory: Negative.    Cardiovascular: Negative.   Gastrointestinal: Negative.   Musculoskeletal: Negative.   Skin: Negative.   Neurological: Negative.   Psychiatric/Behavioral:  Positive for depression. Negative for suicidal ideas.    Blood pressure 115/74, pulse 86, temperature 98.1 F (36.7 C), temperature source Oral, resp. rate 16, height 5\' 4"  (1.626 m), weight 81.6 kg, last menstrual period 05/28/2022, SpO2 100 %. Body mass index is 30.9 kg/m.   Treatment Plan Summary: Plan no change to medication.  Reassured patient that stimulating medicine like Cymbalta often works out better taken in the morning.  No change to dose for today.  Floated the idea with her of possible discharge tomorrow.  Alethia Berthold, MD 06/16/2022, 12:50 PM

## 2022-06-16 NOTE — Progress Notes (Signed)
Patient noted on phone, and dayroom most of early shift. In no distress. Smiling and talking with family/friend in no distress. Pleasant and cooperative iwht care. Voiced no complaints or concerns. No meds due this shift, non requested for sleep. Patient resting with eyes closed in no distress.

## 2022-06-16 NOTE — Group Note (Signed)
Date:  06/16/2022 Time:  9:38 AM  Group Topic/Focus:  Goals Group:   The focus of this group is to help patients establish daily goals to achieve during treatment and discuss how the patient can incorporate goal setting into their daily lives to aide in recovery. Community Meeting    Participation Level:  Active  Participation Quality:  Appropriate  Affect:  Appropriate  Cognitive:  Appropriate  Insight: Appropriate  Engagement in Group:  Engaged  Modes of Intervention:  Discussion, Education, and Support  Additional Comments:    Dorena Bodo 06/16/2022, 9:38 AM

## 2022-06-16 NOTE — Progress Notes (Signed)
Pt denies SI/HI/AVH and verbally agrees to approach staff if these become apparent or before harming themselves/others. Rates depression 0/10. Rates anxiety 0/10. Rates pain 7/10. Pt is engaged in the milieu and pt will pace at times.  Pt is WDL. Scheduled medications administered to pt, per MD orders. RN provided support and encouragement to pt. Q15 min safety checks implemented and continued. Pt is safe on the unit. Plan of care on going and no other concerns expressed at this time.  06/16/22 0837  Psych Admission Type (Psych Patients Only)  Admission Status Involuntary  Psychosocial Assessment  Patient Complaints None  Eye Contact Fair  Facial Expression Animated  Affect Appropriate to circumstance  Speech Logical/coherent  Interaction Assertive  Motor Activity Slow  Appearance/Hygiene Unremarkable  Behavior Characteristics Cooperative;Appropriate to situation;Calm  Mood Pleasant  Aggressive Behavior  Effect No apparent injury  Thought Process  Coherency WDL  Content WDL  Delusions None reported or observed  Perception WDL  Hallucination None reported or observed  Judgment WDL  Confusion None  Danger to Self  Current suicidal ideation? Denies  Danger to Others  Danger to Others None reported or observed

## 2022-06-16 NOTE — Plan of Care (Signed)
  Problem: Education: Goal: Knowledge of General Education information will improve Description: Including pain rating scale, medication(s)/side effects and non-pharmacologic comfort measures Outcome: Progressing   Problem: Activity: Goal: Risk for activity intolerance will decrease Outcome: Progressing   Problem: Nutrition: Goal: Adequate nutrition will be maintained Outcome: Progressing   Problem: Coping: Goal: Level of anxiety will decrease Outcome: Progressing   

## 2022-06-16 NOTE — Group Note (Signed)
Recreation Therapy Group Note   Group Topic:Relaxation  Group Date: 06/16/2022 Start Time: 1000 End Time: 1050 Facilitators: Vilma Prader, LRT, CTRS Location:  Dayroom  Group Description: PMR (Progressive Muscle Relaxation). LRT asks patients their current level of stress/anxiety from 1-10, with 10 being the highest. LRT educates patients on what PMR is and the benefits that come from it. Patients are asked to sit with their feet flat on the floor while sitting up and all the way back in their chair, if possible. LRT follows prompt that requires the patients to tense and release different muscles in their body and focus on their breathing. During session, lights are off and soft music is being played. At the end of the prompt, LRT asks patients to rank their current levels of stress/anxiety from 1-10, 10 being the highest.   Affect/Mood: Appropriate   Participation Level: Active and Engaged   Participation Quality: Independent   Behavior: Appropriate and Calm   Speech/Thought Process: Coherent   Insight: Good   Judgement: Good   Modes of Intervention: Activity and Education   Patient Response to Interventions:  Attentive, Engaged, Interested , and Receptive   Education Outcome:  Acknowledges education   Clinical Observations/Individualized Feedback: Daylene was active in their participation of session activities and group discussion. Pt identified that her anxiety and stress levels were a 1 out of 10, with 10 being in the highest before the session. After, pt rated their anxiety and stress levels a 10. Pt fell asleep during session, which is not uncommon in relaxation session, and shared that she did not sleep well last night.    Plan: Continue to engage patient in RT group sessions 2-3x/week.   Vilma Prader, LRT, CTRS 06/16/2022 11:10 AM

## 2022-06-16 NOTE — Plan of Care (Signed)
  Problem: Safety: Goal: Ability to remain free from injury will improve Outcome: Progressing   

## 2022-06-17 DIAGNOSIS — F332 Major depressive disorder, recurrent severe without psychotic features: Secondary | ICD-10-CM | POA: Diagnosis not present

## 2022-06-17 MED ORDER — DULOXETINE HCL 30 MG PO CPEP: 60.0000 mg | ORAL_CAPSULE | Freq: Every day | ORAL | Status: DC

## 2022-06-17 MED ORDER — FAMOTIDINE 40 MG PO TABS: 40.0000 mg | ORAL_TABLET | Freq: Two times a day (BID) | ORAL | 1 refills | Status: AC

## 2022-06-17 MED ORDER — FAMOTIDINE 20 MG PO TABS: 40.0000 mg | ORAL_TABLET | Freq: Two times a day (BID) | ORAL | Status: DC

## 2022-06-17 MED ORDER — OXCARBAZEPINE 300 MG PO TABS: 300.0000 mg | ORAL_TABLET | Freq: Two times a day (BID) | ORAL | 1 refills | Status: AC

## 2022-06-17 MED ORDER — TRAZODONE HCL 50 MG PO TABS: 50.0000 mg | ORAL_TABLET | Freq: Every evening | ORAL | 1 refills | Status: AC | PRN

## 2022-06-17 MED ORDER — GABAPENTIN 300 MG PO CAPS: 600.0000 mg | ORAL_CAPSULE | Freq: Two times a day (BID) | ORAL | 1 refills | Status: AC

## 2022-06-17 MED ORDER — DULOXETINE HCL 60 MG PO CPEP: 60.0000 mg | ORAL_CAPSULE | Freq: Every day | ORAL | 1 refills | Status: AC

## 2022-06-17 MED ORDER — PANTOPRAZOLE SODIUM 40 MG PO TBEC: 40.0000 mg | DELAYED_RELEASE_TABLET | Freq: Every day | ORAL | 1 refills | Status: AC

## 2022-06-17 MED ORDER — HYDROXYZINE HCL 25 MG PO TABS: 25.0000 mg | ORAL_TABLET | Freq: Three times a day (TID) | ORAL | 1 refills | Status: AC | PRN

## 2022-06-17 NOTE — Plan of Care (Signed)
  Problem: Education: Goal: Knowledge of General Education information will improve Description: Including pain rating scale, medication(s)/side effects and non-pharmacologic comfort measures Outcome: Adequate for Discharge   Problem: Activity: Goal: Risk for activity intolerance will decrease Outcome: Adequate for Discharge   Problem: Nutrition: Goal: Adequate nutrition will be maintained Outcome: Adequate for Discharge   Problem: Coping: Goal: Level of anxiety will decrease Outcome: Adequate for Discharge   

## 2022-06-17 NOTE — BHH Suicide Risk Assessment (Signed)
[facility] Discharge Suicide Risk Assessment   Principal Problem: Severe recurrent major depression without psychotic features ([place]) Discharge Diagnoses: Principal Problem:   Severe recurrent major depression without psychotic features ([place]) Active Problems:   Suicidal behavior   Trigeminal neuralgia of left side of face   Total Time spent with patient: 30 minutes  Musculoskeletal: Strength & Muscle Tone: within normal limits Gait & Station: normal Patient leans: N/A  Psychiatric Specialty Exam  Presentation  General Appearance:  Appropriate for Environment  Eye Contact: Good  Speech: Clear and Coherent  Speech Volume: Normal  Handedness: Right   Mood and Affect  Mood: Anxious; Euthymic; Depressed  Duration of Depression Symptoms: No data recorded Affect: Appropriate; Depressed   Thought Process  Thought Processes: Coherent  Descriptions of Associations:Intact  Orientation:Full (Time, Place and Person)  Thought Content:WDL  History of Schizophrenia/Schizoaffective disorder:No data recorded Duration of Psychotic Symptoms:No data recorded Hallucinations:No data recorded Ideas of Reference:None  Suicidal Thoughts:No data recorded Homicidal Thoughts:No data recorded  Sensorium  Memory: Immediate Good; Recent Good; Remote Good  Judgment: Fair  Insight: Fair   Community education officer  Concentration: Fair  Attention Span: Good  Recall: Good  Fund of Knowledge: Good  Language: Good   Psychomotor Activity  Psychomotor Activity:No data recorded  Assets  Assets: Communication Skills; Desire for Improvement; Social Support; Catering manager; Housing   Sleep  Sleep:No data recorded  Physical Exam: Physical Exam Vitals and nursing note reviewed.  Constitutional:      Appearance: Normal appearance.  HENT:     Head: Normocephalic and atraumatic.     Mouth/Throat:     Pharynx: Oropharynx is clear.  Eyes:     Pupils:  Pupils are equal, round, and reactive to light.  Cardiovascular:     Rate and Rhythm: Normal rate and regular rhythm.  Pulmonary:     Effort: Pulmonary effort is normal.     Breath sounds: Normal breath sounds.  Abdominal:     General: Abdomen is flat.     Palpations: Abdomen is soft.  Musculoskeletal:        General: Normal range of motion.  Skin:    General: Skin is warm and dry.  Neurological:     General: No focal deficit present.     Mental Status: She is alert. Mental status is at baseline.  Psychiatric:        Attention and Perception: Attention normal.        Mood and Affect: Mood normal.        Speech: Speech normal.        Behavior: Behavior normal.        Thought Content: Thought content normal.        Cognition and Memory: Cognition normal.        Judgment: Judgment normal.    Review of Systems  Constitutional: Negative.   HENT: Negative.    Eyes: Negative.   Respiratory: Negative.    Cardiovascular: Negative.   Gastrointestinal: Negative.   Musculoskeletal: Negative.   Skin: Negative.   Neurological: Negative.   Psychiatric/Behavioral: Negative.     Blood pressure 105/72, pulse 92, temperature 98.6 F (37 C), temperature source Oral, resp. rate 18, height 5\' 4"  (1.626 m), weight 81.6 kg, last menstrual period 05/28/2022, SpO2 100 %. Body mass index is 30.9 kg/m.  Mental Status Per Nursing Assessment::   On Admission:  NA  Demographic Factors:  NA  Loss Factors: Financial problems/change in socioeconomic status  Historical Factors: NA  Risk Reduction Factors:  Employed, Positive social support, and Positive therapeutic relationship  Continued Clinical Symptoms:  Depression:   Impulsivity  Cognitive Features That Contribute To Risk:  None    Suicide Risk:  Minimal: No identifiable suicidal ideation.  Patients presenting with no risk factors but with morbid ruminations; may be classified as minimal risk based on the severity of the depressive  symptoms   [place] Follow up.   Why: Please arrive on 06/20/2022 Monday1:40PM paperwork at that time they will schedule for appointments. Contact information: [address] [place] [address] [phone]                 Plan Of Care/Follow-up recommendations:  Other:  Patient has not displayed any dangerous or suicidal behavior in the hospital.  She has been compliant with medication and expresses no suicidal thoughts at discharge and has an upbeat affect and is optimistic about follow-up care.  Patient will be discharged to outpatient care.  Alethia Berthold, MD 06/17/2022, 11:48 AM

## 2022-06-17 NOTE — Progress Notes (Signed)
Discharge note: Suicide safety plan and survey complete. RN met with pt and reviewed pt's discharge instructions. Pt verbalized understanding of discharge instructions and pt did not have any questions. RN reviewed and provided pt with a copy of SRA, AVS and Transition Record. RN returned pt's belongings to pt. Prescriptions were given to pt. Pt denied SI/HI/AVH and voiced no concerns. Pt was appreciative of the care pt received at [facility]. Patient discharged to the lobby without incident.  06/17/22 0830  Psych Admission Type (Psych Patients Only)  Admission Status Involuntary  Psychosocial Assessment  Patient Complaints None  Eye Contact Fair  Facial Expression Animated  Affect Appropriate to circumstance  Speech Logical/coherent  Interaction Assertive  Motor Activity Slow  Appearance/Hygiene Unremarkable  Behavior Characteristics Cooperative;Appropriate to situation;Calm  Mood Pleasant;Euphoric  Aggressive Behavior  Effect No apparent injury  Thought Process  Coherency WDL  Content WDL  Delusions None reported or observed  Perception WDL  Hallucination None reported or observed  Judgment WDL  Confusion None  Danger to Self  Current suicidal ideation? Denies  Danger to Others  Danger to Others None reported or observed

## 2022-06-17 NOTE — Discharge Summary (Signed)
Physician Discharge Summary Note  Patient:  Christine Alvarado is an 42 y.o., female MRN:  [id number] DOB:  [birth year] Patient phone:  [phone] (home)  Patient address:   [address] [place] [address],  Total Time spent with patient: 30 minutes  Date of Admission:  06/14/2022 Date of Discharge: 06/17/2022  Reason for Admission: Patient was admitted to the hospital after an episode of overdose of medication with suicidal ideation and depression  Principal Problem: Severe recurrent major depression without psychotic features [facility]) Discharge Diagnoses: Principal Problem:   Severe recurrent major depression without psychotic features ([place]) Active Problems:   Suicidal behavior   Trigeminal neuralgia of left side of face   Past Psychiatric History: Past history of depression and anxiety and chronic pain  Past Medical History:  Past Medical History:  Diagnosis Date   GERD (gastroesophageal reflux disease)    Nerve disorder    Trigeminal nerve disease     Past Surgical History:  Procedure Laterality Date   BRAIN SURGERY     CARPAL TUNNEL RELEASE Right 04/28/2020   Procedure: CARPAL TUNNEL RELEASE;  Surgeon: Leanora Cover, MD;  Location: [place];  Service: Orthopedics;  Laterality: Right;   LAPAROSCOPIC GASTRIC BAND REMOVAL WITH LAPAROSCOPIC GASTRIC SLEEVE RESECTION  01/08/2019   OTHER SURGICAL HISTORY     microvascular decompression of left side of brain   Family History:  Family History  Problem Relation Age of Onset   Breast cancer Mother 28   Hypertension Mother    Hyperlipidemia Mother    Heart disease Father    Hyperlipidemia Father    Stroke Father    Diabetes Mellitus I Sister    Breast cancer Maternal Aunt    Family Psychiatric  History: See previous Social History:  Social History   Substance and Sexual Activity  Alcohol Use Not Currently   Comment: occ     Social History   Substance and Sexual Activity  Drug Use No     Social History   Socioeconomic History   Marital status: Single    Spouse name: Not on file   Number of children: Not on file   Years of education: Not on file   Highest education level: Not on file  Occupational History   Not on file  Tobacco Use   Smoking status: Never   Smokeless tobacco: Never  Vaping Use   Vaping Use: Never used  Substance and Sexual Activity   Alcohol use: Not Currently    Comment: occ   Drug use: No   Sexual activity: Not on file  Other Topics Concern   Not on file  Social History Narrative   Not on file   Social Determinants of Health   Financial Resource Strain: Not on file  Food Insecurity: Patient Declined (06/14/2022)   Hunger Vital Sign    Worried About Running Out of Food in the Last Year: Patient declined    Ran Out of Food in the Last Year: Patient declined  Transportation Needs: No Transportation Needs (06/14/2022)   PRAPARE - Hydrologist (Medical): No    Lack of Transportation (Non-Medical): No  Physical Activity: Not on file  Stress: Not on file  Social Connections: Not on file    Hospital Course: Admitted to psychiatric hospital.  15-minute checks continued.  Patient did not display any dangerous behavior in the hospital.  She showed good insight and was cooperative with treatment.  Patient was started on Cymbalta for  treatment of depression that would be also helpful with chronic pain issues.  Had some nausea with early dose but agreed to plan to continue medication at discharge.  At the time of discharge denied suicidal ideation had upbeat pleasant affect and appeared to be safe forward discharge from the hospital.  Physical Findings: AIMS: Facial and Oral Movements Muscles of Facial Expression: None, normal Lips and Perioral Area: None, normal Jaw: None, normal Tongue: None, normal,Extremity Movements Upper (arms, wrists, hands, fingers): None, normal Lower (legs, knees, ankles, toes): None, normal,  Trunk Movements Neck, shoulders, hips: None, normal, Overall Severity Severity of abnormal movements (highest score from questions above): None, normal Incapacitation due to abnormal movements: None, normal Patient's awareness of abnormal movements (rate only patient's report): No Awareness, Dental Status Current problems with teeth and/or dentures?: No Does patient usually wear dentures?: No  CIWA:    COWS:     Musculoskeletal: Strength & Muscle Tone: within normal limits Gait & Station: normal Patient leans: N/A   Psychiatric Specialty Exam:  Presentation  General Appearance:  Appropriate for Environment  Eye Contact: Good  Speech: Clear and Coherent  Speech Volume: Normal  Handedness: Right   Mood and Affect  Mood: Anxious; Euthymic; Depressed  Affect: Appropriate; Depressed   Thought Process  Thought Processes: Coherent  Descriptions of Associations:Intact  Orientation:Full (Time, Place and Person)  Thought Content:WDL  History of Schizophrenia/Schizoaffective disorder:No data recorded Duration of Psychotic Symptoms:No data recorded Hallucinations:No data recorded Ideas of Reference:None  Suicidal Thoughts:No data recorded Homicidal Thoughts:No data recorded  Sensorium  Memory: Immediate Good; Recent Good; Remote Good  Judgment: Fair  Insight: Fair   Community education officer  Concentration: Fair  Attention Span: Good  Recall: Good  Fund of Knowledge: Good  Language: Good   Psychomotor Activity  Psychomotor Activity:No data recorded  Assets  Assets: Communication Skills; Desire for Improvement; Social Support; Catering manager; Housing   Sleep  Sleep:No data recorded   Physical Exam: Physical Exam Vitals reviewed.  Constitutional:      Appearance: Normal appearance.  HENT:     Head: Normocephalic and atraumatic.     Mouth/Throat:     Pharynx: Oropharynx is clear.  Eyes:     Pupils: Pupils are  equal, round, and reactive to light.  Cardiovascular:     Rate and Rhythm: Normal rate and regular rhythm.  Pulmonary:     Effort: Pulmonary effort is normal.     Breath sounds: Normal breath sounds.  Abdominal:     General: Abdomen is flat.     Palpations: Abdomen is soft.  Musculoskeletal:        General: Normal range of motion.  Skin:    General: Skin is warm and dry.  Neurological:     General: No focal deficit present.     Mental Status: She is alert. Mental status is at baseline.  Psychiatric:        Attention and Perception: Attention normal.        Mood and Affect: Mood normal.        Speech: Speech normal.        Behavior: Behavior normal.        Thought Content: Thought content normal.        Cognition and Memory: Cognition normal.        Judgment: Judgment normal.    Review of Systems  Constitutional: Negative.   HENT: Negative.    Eyes: Negative.   Respiratory: Negative.    Cardiovascular: Negative.  Gastrointestinal: Negative.   Musculoskeletal: Negative.   Skin: Negative.   Neurological: Negative.   Psychiatric/Behavioral: Negative.     Blood pressure 105/72, pulse 92, temperature 98.6 F (37 C), temperature source Oral, resp. rate 18, height 5\' 4"  (1.626 m), weight 81.6 kg, last menstrual period 05/28/2022, SpO2 100 %. Body mass index is 30.9 kg/m.   Social History   Tobacco Use  Smoking Status Never  Smokeless Tobacco Never   Tobacco Cessation:  N/A, patient does not currently use tobacco products   Blood Alcohol level:  Lab Results  Component Value Date   ETH <10 06/13/2022   ETH <10 [id number]    Metabolic Disorder Labs:  Lab Results  Component Value Date   HGBA1C 5.4 06/15/2022   MPG 108 06/15/2022   No results found for: "PROLACTIN" Lab Results  Component Value Date   CHOL 152 06/15/2022   TRIG 55 06/15/2022   HDL 54 06/15/2022   CHOLHDL 2.8 06/15/2022   VLDL 11 06/15/2022   LDLCALC 87 06/15/2022    See Psychiatric  Specialty Exam and Suicide Risk Assessment completed by Attending Physician prior to discharge.  Discharge destination:  Home  Is patient on multiple antipsychotic therapies at discharge:  No   Has Patient had three or more failed trials of antipsychotic monotherapy by history:  No  Recommended Plan for Multiple Antipsychotic Therapies: NA  Discharge Instructions     Diet - low sodium heart healthy   Complete by: As directed    Increase activity slowly   Complete by: As directed       Allergies as of 06/17/2022       Reactions   Carbamazepine Nausea Only, Rash, Other (See Comments)   Abnormal LFT's and GI Intolerance, too   Oxcarbazepine Anaphylaxis, Other (See Comments)   Patient is taking this in 2024 (??)   Phenytoin Other (See Comments)   Abnormal visual changes- "it makes me go blind"   Silicone Rash   Tape Rash        Medication List     STOP taking these medications    dicyclomine 10 MG capsule Commonly known as: BENTYL   metroNIDAZOLE 0.75 % vaginal gel Commonly known as: METROGEL VAGINAL   metroNIDAZOLE 500 MG tablet Commonly known as: FLAGYL   omeprazole 20 MG capsule Commonly known as: PRILOSEC Replaced by: pantoprazole 40 MG tablet   Vitamin D (Ergocalciferol) 1.25 MG (50000 UNIT) Caps capsule Commonly known as: DRISDOL       TAKE these medications      Indication  DULoxetine 60 MG capsule Commonly known as: CYMBALTA Take 1 capsule (60 mg total) by mouth daily. Start taking on: June 18, 2022  Indication: Major Depressive Disorder   famotidine 40 MG tablet Commonly known as: PEPCID Take 1 tablet (40 mg total) by mouth 2 (two) times daily. What changed:  when to take this reasons to take this  Indication: Gastroesophageal Reflux Disease   gabapentin 300 MG capsule Commonly known as: NEURONTIN Take 2 capsules (600 mg total) by mouth 2 (two) times daily. What changed:  medication strength when to take this  Indication:  Trigeminal Nerve Pain   hydrOXYzine 25 MG tablet Commonly known as: ATARAX Take 1 tablet (25 mg total) by mouth 3 (three) times daily as needed for anxiety.  Indication: Feeling Anxious   Oxcarbazepine 300 MG tablet Commonly known as: TRILEPTAL Take 1 tablet (300 mg total) by mouth 2 (two) times daily. What changed:  medication strength when to  take this additional instructions  Indication: Trigeminal Nerve Pain   pantoprazole 40 MG tablet Commonly known as: PROTONIX Take 1 tablet (40 mg total) by mouth daily. Start taking on: June 18, 2022 Replaces: omeprazole 20 MG capsule  Indication: Gastroesophageal Reflux Disease   traZODone 50 MG tablet Commonly known as: DESYREL Take 1 tablet (50 mg total) by mouth at bedtime as needed for sleep.  Indication: Trouble Sleeping   Wegovy 1.7 MG/0.75ML Soaj Generic drug: Semaglutide-Weight Management Inject 1.7 mg into the skin every 7 (seven) days.  Indication: [place] Follow up.   Why: Please arrive on 06/20/2022 Monday1:40PM paperwork at that time they will schedule for appointments. Contact information: [place] [address] [phone]                 Follow-up recommendations:  Other:  Follow-up care outpatient mental health as indicated.  Encourage therapy and medication management  Comments: See above  Signed: Alethia Berthold, MD 06/17/2022, 6:28 PM

## 2022-06-17 NOTE — Discharge Summary (Signed)
Physician Discharge Summary Note  Patient:  Christine Alvarado is an 42 y.o., female MRN:  [id number] DOB:  Nov 05, 1980 Patient phone:  [phone] (home)  Patient address:   [place] [address],  Total Time spent with patient: 30 minutes  Date of Admission:  06/14/2022 Date of Discharge: 06/17/2022  Reason for Admission: Patient was admitted after taking an impulsive overdose of prescription medicine.  History of depression.  Principal Problem: Severe recurrent major depression without psychotic features [facility]) Discharge Diagnoses: Principal Problem:   Severe recurrent major depression without psychotic features ([place]) Active Problems:   Suicidal behavior   Trigeminal neuralgia of left side of face   Past Psychiatric History: History of depression and anxiety and chronic pain  Past Medical History:  Past Medical History:  Diagnosis Date   GERD (gastroesophageal reflux disease)    Nerve disorder    Trigeminal nerve disease     Past Surgical History:  Procedure Laterality Date   BRAIN SURGERY     CARPAL TUNNEL RELEASE Right 04/28/2020   Procedure: CARPAL TUNNEL RELEASE;  Surgeon: Leanora Cover, MD;  Location: [place];  Service: Orthopedics;  Laterality: Right;   LAPAROSCOPIC GASTRIC BAND REMOVAL WITH LAPAROSCOPIC GASTRIC SLEEVE RESECTION  01/08/2019   OTHER SURGICAL HISTORY     microvascular decompression of left side of brain   Family History:  Family History  Problem Relation Age of Onset   Breast cancer Mother 32   Hypertension Mother    Hyperlipidemia Mother    Heart disease Father    Hyperlipidemia Father    Stroke Father    Diabetes Mellitus I Sister    Breast cancer Maternal Aunt    Family Psychiatric  History: See previous Social History:  Social History   Substance and Sexual Activity  Alcohol Use Not Currently   Comment: occ     Social History   Substance and Sexual Activity  Drug Use No    Social History    Socioeconomic History   Marital status: Single    Spouse name: Not on file   Number of children: Not on file   Years of education: Not on file   Highest education level: Not on file  Occupational History   Not on file  Tobacco Use   Smoking status: Never   Smokeless tobacco: Never  Vaping Use   Vaping Use: Never used  Substance and Sexual Activity   Alcohol use: Not Currently    Comment: occ   Drug use: No   Sexual activity: Not on file  Other Topics Concern   Not on file  Social History Narrative   Not on file   Social Determinants of Health   Financial Resource Strain: Not on file  Food Insecurity: Patient Declined (06/14/2022)   Hunger Vital Sign    Worried About Running Out of Food in the Last Year: Patient declined    Ran Out of Food in the Last Year: Patient declined  Transportation Needs: No Transportation Needs (06/14/2022)   PRAPARE - Hydrologist (Medical): No    Lack of Transportation (Non-Medical): No  Physical Activity: Not on file  Stress: Not on file  Social Connections: Not on file    Hospital Course: Patient admitted to psychiatric ward.  She did not display any dangerous aggressive suicidal or violent behavior on the unit.  She was cooperative with treatment including medication management.  Patient attended groups and interacted appropriately with others.  At  the time of discharge affect is upbeat and stable and mood is stated as improved.  She completely denies suicidal ideation.  She did mention having some upset stomach from the initial dose of Cymbalta.  We reviewed the options with this and patient has been advised to continue Cymbalta and see if the side effects get better and if not follow-up with her primary physician or mental health provider.  Physical Findings: AIMS: Facial and Oral Movements Muscles of Facial Expression: None, normal Lips and Perioral Area: None, normal Jaw: None, normal Tongue: None,  normal,Extremity Movements Upper (arms, wrists, hands, fingers): None, normal Lower (legs, knees, ankles, toes): None, normal, Trunk Movements Neck, shoulders, hips: None, normal, Overall Severity Severity of abnormal movements (highest score from questions above): None, normal Incapacitation due to abnormal movements: None, normal Patient's awareness of abnormal movements (rate only patient's report): No Awareness, Dental Status Current problems with teeth and/or dentures?: No Does patient usually wear dentures?: No  CIWA:    COWS:     Musculoskeletal: Strength & Muscle Tone: within normal limits Gait & Station: normal Patient leans: N/A   Psychiatric Specialty Exam:  Presentation  General Appearance:  Appropriate for Environment  Eye Contact: Good  Speech: Clear and Coherent  Speech Volume: Normal  Handedness: Right   Mood and Affect  Mood: Anxious; Euthymic; Depressed  Affect: Appropriate; Depressed   Thought Process  Thought Processes: Coherent  Descriptions of Associations:Intact  Orientation:Full (Time, Place and Person)  Thought Content:WDL  History of Schizophrenia/Schizoaffective disorder:No data recorded Duration of Psychotic Symptoms:No data recorded Hallucinations:No data recorded Ideas of Reference:None  Suicidal Thoughts:No data recorded Homicidal Thoughts:No data recorded  Sensorium  Memory: Immediate Good; Recent Good; Remote Good  Judgment: Fair  Insight: Fair   Community education officer  Concentration: Fair  Attention Span: Good  Recall: Good  Fund of Knowledge: Good  Language: Good   Psychomotor Activity  Psychomotor Activity:No data recorded  Assets  Assets: Communication Skills; Desire for Improvement; Social Support; Catering manager; Housing   Sleep  Sleep:No data recorded   Physical Exam: Physical Exam Vitals and nursing note reviewed.  Constitutional:      Appearance: Normal  appearance.  HENT:     Head: Normocephalic and atraumatic.     Mouth/Throat:     Pharynx: Oropharynx is clear.  Eyes:     Pupils: Pupils are equal, round, and reactive to light.  Cardiovascular:     Rate and Rhythm: Normal rate and regular rhythm.  Pulmonary:     Effort: Pulmonary effort is normal.     Breath sounds: Normal breath sounds.  Abdominal:     General: Abdomen is flat.     Palpations: Abdomen is soft.  Musculoskeletal:        General: Normal range of motion.  Skin:    General: Skin is warm and dry.  Neurological:     General: No focal deficit present.     Mental Status: She is alert. Mental status is at baseline.  Psychiatric:        Attention and Perception: Attention normal.        Mood and Affect: Mood normal.        Speech: Speech normal.        Behavior: Behavior normal.        Thought Content: Thought content normal.        Cognition and Memory: Cognition normal.    Review of Systems  Constitutional: Negative.   HENT: Negative.    Eyes:  Negative.   Respiratory: Negative.    Cardiovascular: Negative.   Gastrointestinal: Negative.   Musculoskeletal: Negative.   Skin: Negative.   Neurological: Negative.   Psychiatric/Behavioral: Negative.     Blood pressure 105/72, pulse 92, temperature 98.6 F (37 C), temperature source Oral, resp. rate 18, height 5\' 4"  (1.626 m), weight 81.6 kg, last menstrual period 05/28/2022, SpO2 100 %. Body mass index is 30.9 kg/m.   Social History   Tobacco Use  Smoking Status Never  Smokeless Tobacco Never   Tobacco Cessation:  N/A, patient does not currently use tobacco products   Blood Alcohol level:  Lab Results  Component Value Date   ETH <10 06/13/2022   ETH <10 [id number]    Metabolic Disorder Labs:  Lab Results  Component Value Date   HGBA1C 5.4 06/15/2022   MPG 108 06/15/2022   No results found for: "PROLACTIN" Lab Results  Component Value Date   CHOL 152 06/15/2022   TRIG 55 06/15/2022   HDL 54  06/15/2022   CHOLHDL 2.8 06/15/2022   VLDL 11 06/15/2022   LDLCALC 87 06/15/2022    See Psychiatric Specialty Exam and Suicide Risk Assessment completed by Attending Physician prior to discharge.  Discharge destination:  Home  Is patient on multiple antipsychotic therapies at discharge:  No   Has Patient had three or more failed trials of antipsychotic monotherapy by history:  No  Recommended Plan for Multiple Antipsychotic Therapies: NA  Discharge Instructions     Diet - low sodium heart healthy   Complete by: As directed    Increase activity slowly   Complete by: As directed       Allergies as of 06/17/2022       Reactions   Carbamazepine Nausea Only, Rash, Other (See Comments)   Abnormal LFT's and GI Intolerance, too   Oxcarbazepine Anaphylaxis, Other (See Comments)   Patient is taking this in 2024 (??)   Phenytoin Other (See Comments)   Abnormal visual changes- "it makes me go blind"   Silicone Rash   Tape Rash        Medication List     STOP taking these medications    dicyclomine 10 MG capsule Commonly known as: BENTYL   metroNIDAZOLE 0.75 % vaginal gel Commonly known as: METROGEL VAGINAL   metroNIDAZOLE 500 MG tablet Commonly known as: FLAGYL   omeprazole 20 MG capsule Commonly known as: PRILOSEC Replaced by: pantoprazole 40 MG tablet   Vitamin D (Ergocalciferol) 1.25 MG (50000 UNIT) Caps capsule Commonly known as: DRISDOL       TAKE these medications      Indication  DULoxetine 60 MG capsule Commonly known as: CYMBALTA Take 1 capsule (60 mg total) by mouth daily. Start taking on: June 18, 2022  Indication: Major Depressive Disorder   famotidine 40 MG tablet Commonly known as: PEPCID Take 1 tablet (40 mg total) by mouth 2 (two) times daily. What changed:  when to take this reasons to take this  Indication: Gastroesophageal Reflux Disease   gabapentin 300 MG capsule Commonly known as: NEURONTIN Take 2 capsules (600 mg total) by  mouth 2 (two) times daily. What changed:  medication strength when to take this  Indication: Trigeminal Nerve Pain   hydrOXYzine 25 MG tablet Commonly known as: ATARAX Take 1 tablet (25 mg total) by mouth 3 (three) times daily as needed for anxiety.  Indication: Feeling Anxious   Oxcarbazepine 300 MG tablet Commonly known as: TRILEPTAL Take 1 tablet (300 mg total) by  mouth 2 (two) times daily. What changed:  medication strength when to take this additional instructions  Indication: Trigeminal Nerve Pain   pantoprazole 40 MG tablet Commonly known as: PROTONIX Take 1 tablet (40 mg total) by mouth daily. Start taking on: June 18, 2022 Replaces: omeprazole 20 MG capsule  Indication: Gastroesophageal Reflux Disease   traZODone 50 MG tablet Commonly known as: DESYREL Take 1 tablet (50 mg total) by mouth at bedtime as needed for sleep.  Indication: Trouble Sleeping   Wegovy 1.7 MG/0.75ML Soaj Generic drug: Semaglutide-Weight Management Inject 1.7 mg into the skin every 7 (seven) days.  Indication: [place] Follow up.   Why: Please arrive on 06/20/2022 Monday1:40PM paperwork at that time they will schedule for appointments. Contact information: [place] [address] [phone]                 Follow-up recommendations:  Other:  Follow-up with outpatient mental health provider.  Continue current medication for now.  Avoid alcohol and drug use.  Comments: See above  Signed: Alethia Berthold, MD 06/17/2022, 11:58 AM

## 2022-06-17 NOTE — Progress Notes (Signed)
  [facility] Adult Case Management Discharge Plan :  Will you be returning to the same living situation after discharge:  Yes,  pt reports that she is returning home.  At discharge, do you have transportation home?: Yes,  pt reports that famil will provide transportation.  Do you have the ability to pay for your medications: Yes,  [employer]  Release of information consent forms completed and in the chart;  Patient's signature needed at discharge.  Patient to Follow up at:  [place] Follow up.   Why: Please arrive on 06/20/2022 Monday1:40PM paperwork at that time they will schedule for appointments. Contact information: [place] [address] [phone]                 Next level of care provider has access to [place] and Suicide Prevention discussed: Yes,  SPE completed with the patient.       Has patient been referred to the Quitline?: N/A patient is not a smoker  Patient has been referred for addiction treatment: Pt. refused referral  Rozann Lesches, LCSW 06/17/2022, 11:15 AM

## 2022-06-17 NOTE — Group Note (Signed)
Recreation Therapy Group Note   Group Topic:Self-Esteem  Group Date: 06/17/2022 Start Time: 1000 End Time: 1105 Facilitators: Vilma Prader, LRT, CTRS Location:  Craft Room  Group Description: Patients and LRT discussed the importance of self-love and self-esteem. Pt completed a worksheet that helps them identify 24 different strengths and qualities about themselves. Pt encouraged to read aloud at least 3 off their sheet to the group. LRT and pts discussed how this can be applied to daily life post-discharge.  Pt's then played "Positive Affirmation Bingo" afterwards, with journals or stress balls as bingo prizes.   Affect/Mood: Appropriate, Congruent, and Happy   Participation Level: Active and Engaged   Participation Quality: Independent   Behavior: Appropriate, Attentive , Calm, and Cooperative   Speech/Thought Process: Coherent   Insight: Good   Judgement: Good   Modes of Intervention: Guided Discussion and Worksheet   Patient Response to Interventions:  Attentive, Engaged, Interested , and Receptive   Education Outcome:  Acknowledges education   Clinical Observations/Individualized Feedback: Kaavya was active in their participation of session activities and group discussion. 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 Pt also spontaneously contributed to group discussion multiple times while having a bright affect.   Plan: Continue to engage patient in RT group sessions 2-3x/week.   Vilma Prader, LRT, CTRS 06/17/2022 11:16 AM

## 2022-10-10 ENCOUNTER — Other Ambulatory Visit: Payer: Self-pay | Admitting: Obstetrics and Gynecology

## 2022-10-10 DIAGNOSIS — Z1231 Encounter for screening mammogram for malignant neoplasm of breast: Secondary | ICD-10-CM

## 2022-11-25 ENCOUNTER — Ambulatory Visit
Admission: RE | Admit: 2022-11-25 | Discharge: 2022-11-25 | Disposition: A | Discharge status: Home / Self Care | Payer: BC Managed Care – PPO | Source: Ambulatory Visit | Attending: Obstetrics and Gynecology | Admitting: Obstetrics and Gynecology

## 2022-11-25 DIAGNOSIS — Z1231 Encounter for screening mammogram for malignant neoplasm of breast: Secondary | ICD-10-CM

## 2023-07-04 IMAGING — MG MM DIGITAL DIAGNOSTIC UNILAT*R* W/ TOMO W/ CAD
4 series · 4 of 12 positions shown · non-contrast
Comparison: Previous exam(s).

CLINICAL DATA: Follow-up suspected right breast asymmetry on a
screening mammogram dated 11/17/2020, not definitely seen in the
patient return for a right diagnostic mammogram with no
abnormalities seen on ultrasound.

EXAM:
DIGITAL DIAGNOSTIC UNILATERAL RIGHT MAMMOGRAM WITH TOMOSYNTHESIS AND
CAD
TECHNIQUE: Right digital diagnostic mammography and breast tomosynthesis was
performed. The images were evaluated with computer-aided detection.

[R MLO synth-2D]
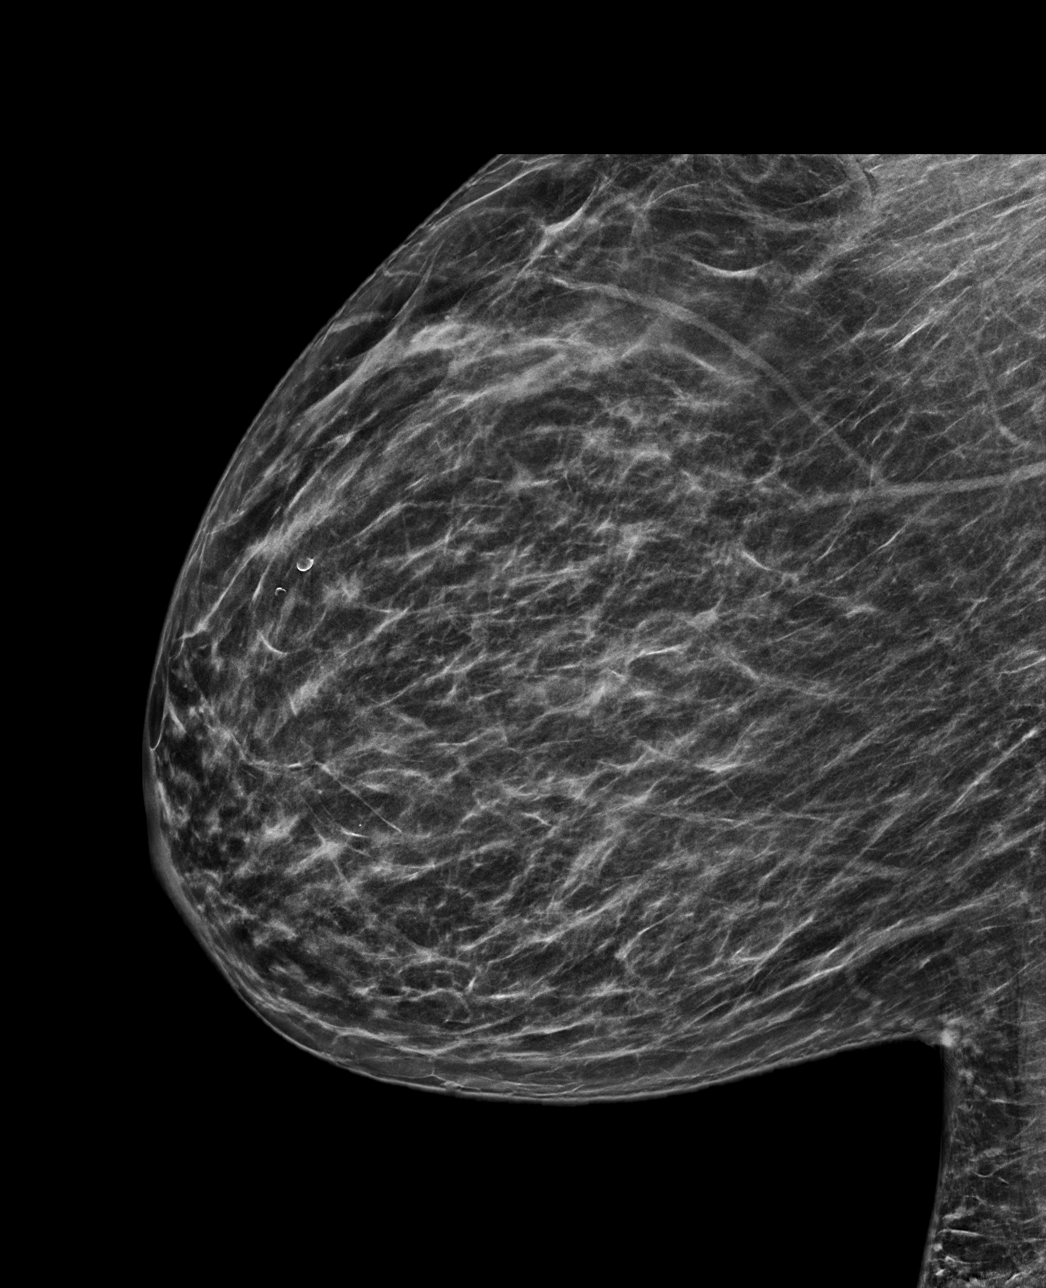

[R CC synth-2D]
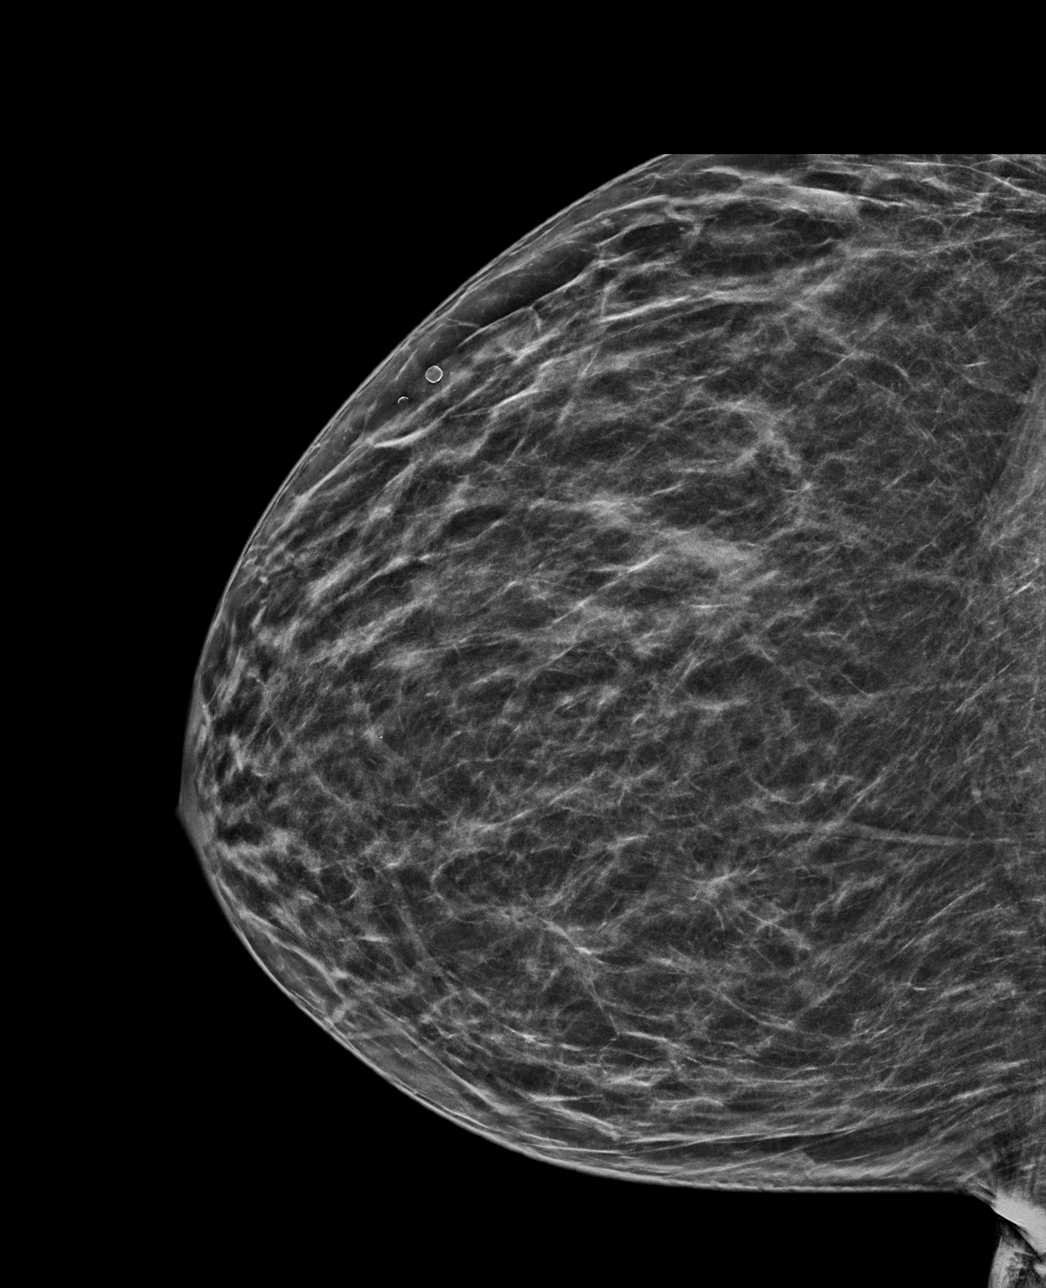

[R CC tomo · tomo slice 31/62.0]
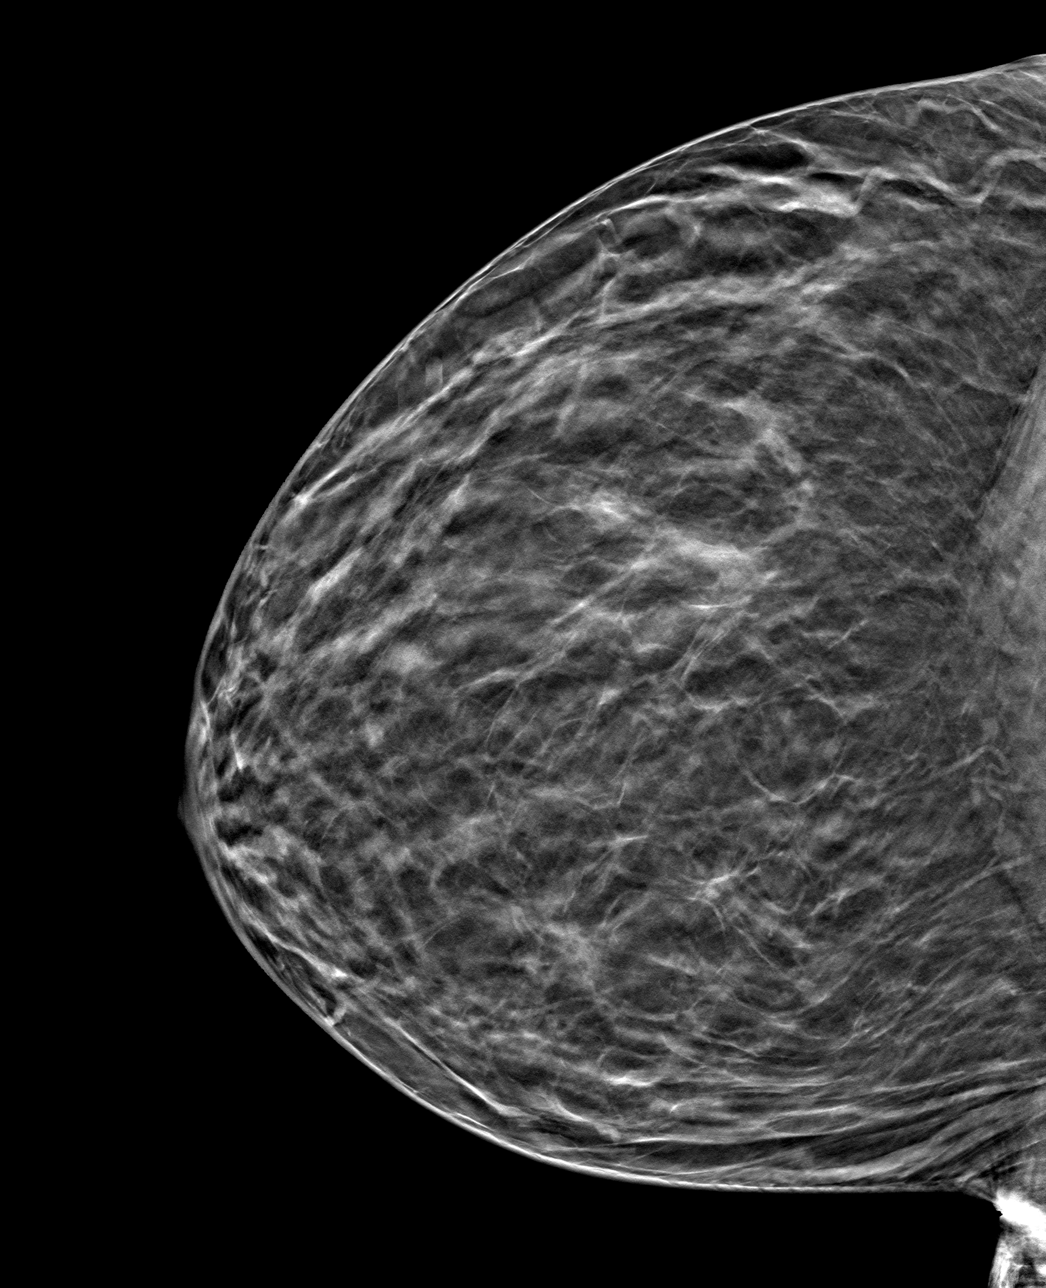

[R MLO tomo · tomo slice 33/65.0]
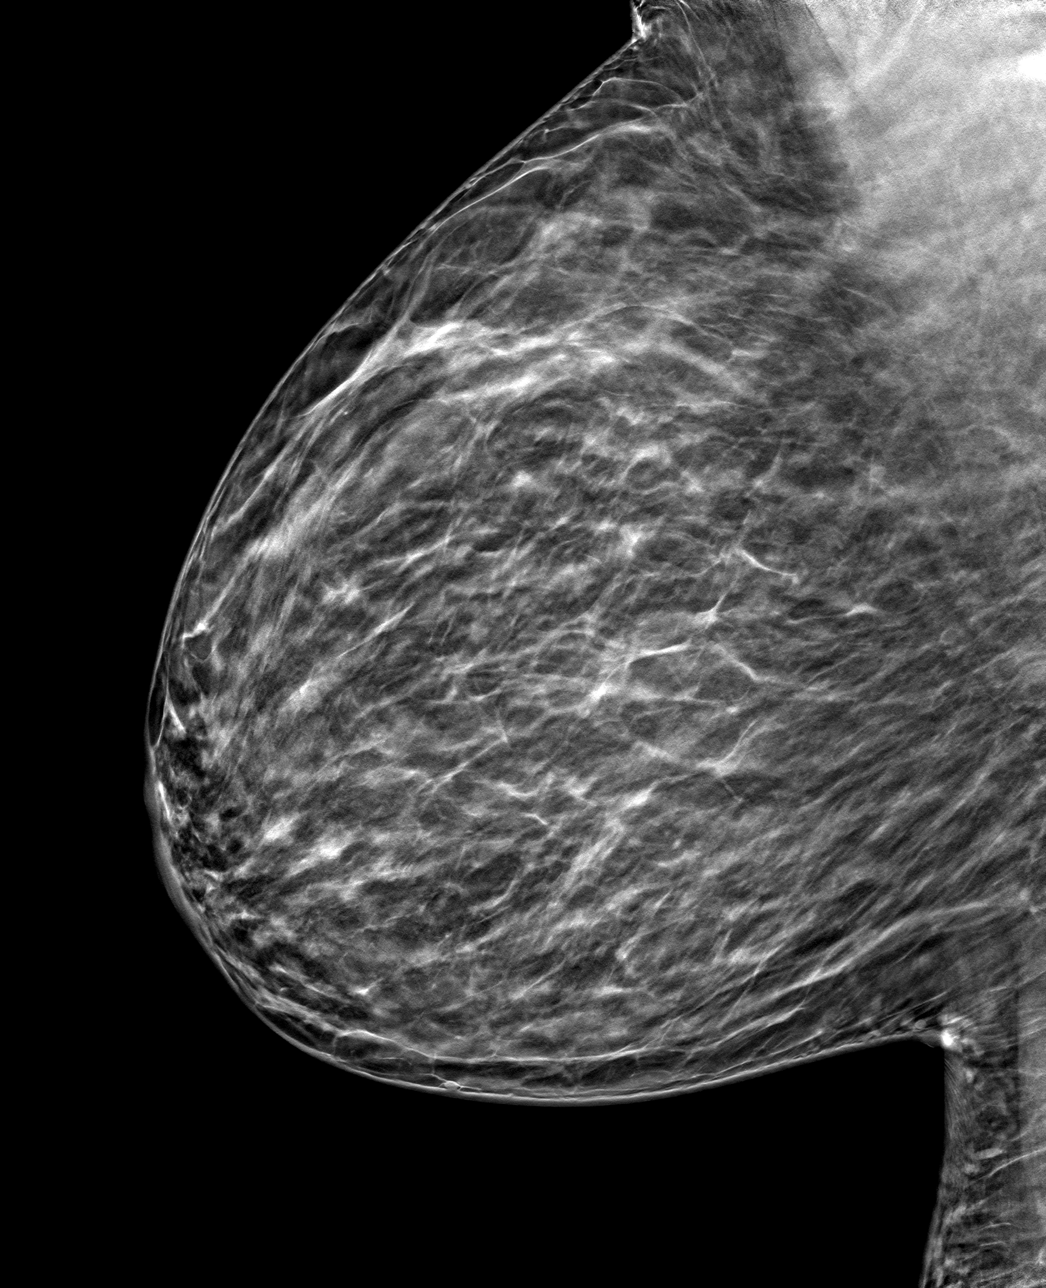

[4 of 12 positions shown; findings below may reference images not displayed]

ACR Breast Density Category b: There are scattered areas of
fibroglandular density.
FINDINGS: Mammographically normal appearing right breast with no asymmetry or
other abnormalities
IMPRESSION: No evidence of malignancy.  No persistent mammographic asymmetry.

RECOMMENDATION:
Bilateral screening mammogram in October 2021 when due.

I have discussed the findings and recommendations with the patient.
If applicable, a reminder letter will be sent to the patient
regarding the next appointment.

BI-RADS CATEGORY  1: Negative.

## 2023-11-23 ENCOUNTER — Other Ambulatory Visit: Payer: Self-pay | Admitting: Obstetrics and Gynecology

## 2023-11-23 DIAGNOSIS — Z1231 Encounter for screening mammogram for malignant neoplasm of breast: Secondary | ICD-10-CM

## 2023-11-30 ENCOUNTER — Ambulatory Visit: Payer: Self-pay

## 2023-12-05 ENCOUNTER — Ambulatory Visit
Admission: RE | Admit: 2023-12-05 | Discharge: 2023-12-05 | Disposition: A | Discharge status: Home / Self Care | Payer: Self-pay | Source: Ambulatory Visit | Attending: Obstetrics and Gynecology | Admitting: Obstetrics and Gynecology

## 2023-12-05 DIAGNOSIS — Z1231 Encounter for screening mammogram for malignant neoplasm of breast: Secondary | ICD-10-CM

## 2023-12-12 ENCOUNTER — Other Ambulatory Visit: Payer: Self-pay | Admitting: Medical Genetics

## 2023-12-20 ENCOUNTER — Other Ambulatory Visit

## 2023-12-21 ENCOUNTER — Other Ambulatory Visit

## 2024-01-24 ENCOUNTER — Other Ambulatory Visit

## 2024-01-24 DIAGNOSIS — Z006 Encounter for examination for normal comparison and control in clinical research program: Secondary | ICD-10-CM

## 2024-02-03 LAB — GENECONNECT MOLECULAR SCREEN: Genetic Analysis Overall Interpretation: NEGATIVE
